# Patient Record
Sex: Male | Born: 1970
Health system: Southern US, Community
[De-identification: ages and names within clinical notes are randomized; demographics above are authoritative.]

## PROBLEM LIST (undated history)

## (undated) HISTORY — PX: BACK SURGERY: SHX140

---

## 2004-11-26 ENCOUNTER — Encounter: Admission: RE | Admit: 2004-11-26 | Discharge: 2004-11-26 | Payer: Self-pay | Admitting: Neurosurgery

## 2005-03-01 ENCOUNTER — Ambulatory Visit (HOSPITAL_COMMUNITY): Admission: RE | Admit: 2005-03-01 | Discharge: 2005-03-02 | Payer: Self-pay | Admitting: Neurosurgery

## 2005-08-22 ENCOUNTER — Ambulatory Visit (HOSPITAL_COMMUNITY): Admission: RE | Admit: 2005-08-22 | Discharge: 2005-08-22 | Payer: Self-pay | Admitting: Neurosurgery

## 2006-01-31 ENCOUNTER — Ambulatory Visit (HOSPITAL_COMMUNITY): Admission: RE | Admit: 2006-01-31 | Discharge: 2006-02-01 | Payer: Self-pay | Admitting: Neurosurgery

## 2006-02-07 ENCOUNTER — Ambulatory Visit (HOSPITAL_COMMUNITY): Admission: RE | Admit: 2006-02-07 | Discharge: 2006-02-08 | Payer: Self-pay | Admitting: Neurosurgery

## 2007-05-06 ENCOUNTER — Emergency Department (HOSPITAL_COMMUNITY): Admission: EM | Admit: 2007-05-06 | Discharge: 2007-05-06 | Payer: Self-pay | Admitting: Emergency Medicine
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
## 2011-03-25 NOTE — H&P (Signed)
[name], [name] NO.:  [id number]   MEDICAL RECORD NO.:  [id number]          PATIENT TYPE:  OIB   LOCATION:  3029                         FACILITY:  [facility]   PHYSICIAN:  Payton Doughty, M.D.      DATE OF BIRTH:  [birth year]   DATE OF ADMISSION:  02/07/2006  DATE OF DISCHARGE:                                HISTORY & PHYSICAL   ADMISSION DIAGNOSIS:  Spinal cord stimulator in place, intractable back  pain.   BODY OF TEXT:  This is a 40 year old gentleman who has had a lumbar  diskectomy and foraminal disk at L4-5 on the left side. She still had left  L4 radiculopathy related to chronic irritation of the nerve root. There is  no decompression available. He has a spinal cord stimulator placed last week  which has given maximum coverage with relief of leg pain. He is admitted now  for placement of battery pack.   PAST MEDICAL HISTORY:  Unremarkable.   PAST SURGICAL HISTORY:  Lumbar diskectomy.   MEDICATIONS:  Pain medications and Lortab.   ALLERGIES:  None.   PHYSICAL EXAMINATION:  GENERAL: He is obese, but otherwise unremarkable.  HEENT: Normal.  NECK: Reasonable range of motion of the neck.  CHEST: Clear.  CARDIAC: Regular rate and rhythm.  ABDOMEN: Nontender without hepatosplenomegaly, but somewhat large.  EXTREMITIES: Without clubbing or cyanosis. Peripheral pulses are good.  GU: Deferred.  NEUROLOGIC: He is awake, alert, and oriented. Cranial nerves are intact.  Motor exam shows 5/5 strength throughout the upper and lower extremities.  Sensation is diminished in the right L4 distribution and his right knee jerk  is absent.   CLINICAL IMPRESSION:  Good relief with his spinal cord stimulator.   PLAN:  Placement of battery. Risks and benefits have been discussed and he  wishes to proceed.           ______________________________  Payton Doughty, M.D.     MWR/MEDQ  D:  02/07/2006  T:  02/07/2006  Job:  [id number]

## 2011-03-25 NOTE — Op Note (Signed)
[name], [name] NO.:  [id number]   MEDICAL RECORD NO.:  [id number]          PATIENT TYPE:  OIB   LOCATION:  3029                         FACILITY:  [facility]   PHYSICIAN:  Payton Doughty, M.D.      DATE OF BIRTH:  June 20, 1971   DATE OF PROCEDURE:  02/07/2006  DATE OF DISCHARGE:  02/08/2006                                 OPERATIVE REPORT   PREOPERATIVE DIAGNOSIS:  Implanted spinal cord stimulator.   POSTOPERATIVE DIAGNOSIS:  Implanted spinal cord stimulator.   OPERATIVE PROCEDURE:  Implantation of battery.   ANESTHESIA:  General endotracheal.   PREP:  Sterile Betadine prep and scrub with alcohol wipe.   COMPLICATIONS:  None.   ASSISTANT:  [place].   INDICATIONS:  This is a 40 year old gentleman with spinal cord stimulator in  place for a trial. He is getting good coverage, so we planned to have the  battery taken out.   DESCRIPTION OF PROCEDURE:  The patient was taken to the operating room,  smoothly anesthetized, and placed prone on the operating table. The old  incision was opened, the wires mobilized. The extension wires were detached  and removed. Subcutaneous pocket was created below the posterior iliac spine  on the right side, and the tunneling device was used to move the wires from  the stimulator site to the battery site. The battery was attached. The  string leads devices attached andthe battery implanted in the pocket.  Impendence testing revealed satisfactory impendences. Both wounds were  irrigated. Hemostasis assured, and the subcutaneous tissues were  reapproximated with 0 Vicryl in interrupted fashion. Subcuticular tissues  were reapproximated with 3-0 Vicryl interrupted fashion. Skin was closed  with 3-0 nylon in a running locked fashion. Betadine and Telfa dressing was  applied and made occlusive with Opsite. The patient returned to the recovery  room in good condition.           ______________________________  Payton Doughty,  M.D.     MWR/MEDQ  D:  02/07/2006  T:  02/08/2006  Job:  [id number]

## 2011-03-25 NOTE — Op Note (Signed)
NAMEORLANDA, LEMMERMAN NO.:  [id number]   MEDICAL RECORD NO.:  [id number]          PATIENT TYPE:  OIB   LOCATION:  2860                         FACILITY:  [facility]   PHYSICIAN:  Payton Doughty, M.D.      DATE OF BIRTH:  1971-06-16   DATE OF PROCEDURE:  03/01/2005  DATE OF DISCHARGE:                                 OPERATIVE REPORT   PREOPERATIVE DIAGNOSIS:  Foraminal disk at L4-5 on the left.   POSTOPERATIVE DIAGNOSIS:  Foraminal disk at L4-5 on the left.   OPERATIVE PROCEDURE:  L4-5 foraminal diskectomy.   SURGEON:  Payton Doughty, M.D.   NURSE ASSISTANT:  [facility].   DOCTOR ASSISTANT:  Stefani Dama, M.D.   ANESTHESIA:  General endotracheal.   PREPARATION:  Sterile Betadine prep and scrub with alcohol wipe.   COMPLICATIONS:  None.   BODY OF TEXT:  A 39 year old gentleman with foraminal disk on the left side  at L4-5.  Taken to the operating room and smoothly anesthetized and  intubated, placed prone on the operating table.  Following shave, prep and  drape in the usual sterile fashion, the skin was incised from the mid-L3 to  mid-L5 and the lamina of L3, L4 and L5 were exposed on the left side in the  subperiosteal plane out over the transverse processes.  Several x-rays were  needed to confirm correctness of the level.  Having confirmed correctness of  the level, the pars interarticularis and a little bit of the inferior  articular process of L4 were drilled off and the ligamentum flavum removed  and the L4 nerve root identified as it traversed the pedicular area.  The  disk space was readily identified and disk material removed laterally.  Milking underneath the annulus, a fragment of disk came out from underneath  the nerve root, resulting in its immediate decompression.  The wound was  irrigated and hemostasis assured, the nerve root explored in all quadrants  and found to be free.  The bone defect was filled with Depo-Medrol-soaked  fat.  The fascia and  subcutaneous tissue were reapproximated with 0 Vicryl  in interrupted fashion, the subcuticular tissue was reapproximated with 3-0  Vicryl, and the skin was closed with 3-0 nylon in a running locked fashion.  Betadine and Telfa dressing was applied, made occlusive with OpSite.  The  patient returned to the recovery room in good condition.     MWR/MEDQ  D:  03/01/2005  T:  03/01/2005  Job:  [id number]

## 2011-03-25 NOTE — Op Note (Signed)
NAMEGREGORIO, [name] NO.:  [id number]   MEDICAL RECORD NO.:  [id number]          PATIENT TYPE:  OIB   LOCATION:  3034                         FACILITY:  [facility]   PHYSICIAN:  Payton Doughty, M.D.      DATE OF BIRTH:  11-Nov-1970   DATE OF PROCEDURE:  01/31/2006  DATE OF DISCHARGE:  02/01/2006                                 OPERATIVE REPORT   PREOPERATIVE DIAGNOSIS:  Intractable left leg pain.   POSTOPERATIVE DIAGNOSIS:  Intractable left leg pain.   OPERATION PERFORMED:  Placement of spinal cord stimulator.   SURGEON:  Payton Doughty, M.D.   ANESTHESIA:  General endotracheal.   PREP:  Sterile Betadine prep and scrub with alcohol wipe.   COMPLICATIONS:  None.   NURSE ASSISTANT:  [place].   DESCRIPTION OF PROCEDURE:  40 year old gentleman with intractable left leg  pain related to nerve root injury.  The patient was taken to the operating  room, smoothly anesthetized, intubated, and placed prone on the operating  table.  Following shave, prep, drape in usual sterile fashion, skin was  infiltrated with 1% lidocaine with 1:400,000 epinephrine.  Skin was incised  from the mid T10 to the bottom of T11.  The lamina of T11 was exposed in  subperiosteal plane bilaterally.  Intraoperative x-ray confirmed correctness  of level.  Having confirmed correctness of level, partial laminectomy of T11  was done to the top of the ligamentum flavum.  It was removed.  The epidural  fat was excised and a stimulating electrode was placed in the epidural  space.  It passed smoothly.  Intraoperative x-ray showed that the electrode  was at the T10-11 interspace extending up to just slightly above the T8-9  interspace exactly in the midline.  Wound was irrigated, hemostasis assured.  The anchoring suture was placed.  Connecting wires attached.  Wires passed  through the subcutaneous tunnel. The incision was then closed with  successive layers of 0 Vicryl, 3-0 Vicryl and 3-0 nylon.   Betadine and Telfa  dressing was applied at both sites.  The patient was then transferred to the  recovery room in good condition.           ______________________________  Payton Doughty, M.D.     MWR/MEDQ  D:  01/31/2006  T:  02/02/2006  Job:  [id number]
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
## 2019-03-06 ENCOUNTER — Emergency Department (HOSPITAL_COMMUNITY)
Admission: EM | Admit: 2019-03-06 | Discharge: 2019-03-06 | Disposition: A | Discharge status: Home / Self Care | Payer: Medicaid - Out of State | Attending: Emergency Medicine | Admitting: Emergency Medicine

## 2019-03-06 ENCOUNTER — Other Ambulatory Visit: Payer: Self-pay

## 2019-03-06 ENCOUNTER — Emergency Department (HOSPITAL_COMMUNITY): Payer: Medicaid - Out of State

## 2019-03-06 ENCOUNTER — Encounter (HOSPITAL_COMMUNITY): Payer: Self-pay | Admitting: Emergency Medicine

## 2019-03-06 DIAGNOSIS — R600 Localized edema: Secondary | ICD-10-CM | POA: Insufficient documentation

## 2019-03-06 DIAGNOSIS — L97519 Non-pressure chronic ulcer of other part of right foot with unspecified severity: Secondary | ICD-10-CM | POA: Insufficient documentation

## 2019-03-06 DIAGNOSIS — F17210 Nicotine dependence, cigarettes, uncomplicated: Secondary | ICD-10-CM | POA: Diagnosis not present

## 2019-03-06 LAB — COMPREHENSIVE METABOLIC PANEL
ALT: 54 U/L — ABNORMAL HIGH (ref 0–44)
AST: 58 U/L — ABNORMAL HIGH (ref 15–41)
Albumin: 3.6 g/dL (ref 3.5–5.0)
Alkaline Phosphatase: 47 U/L (ref 38–126)
Anion gap: 10 (ref 5–15)
BUN: 11 mg/dL (ref 6–20)
CO2: 26 mmol/L (ref 22–32)
Calcium: 9 mg/dL (ref 8.9–10.3)
Chloride: 102 mmol/L (ref 98–111)
Creatinine, Ser: 0.9 mg/dL (ref 0.61–1.24)
GFR calc Af Amer: >60 mL/min (ref >60)
GFR calc non Af Amer: >60 mL/min (ref >60)
Glucose, Bld: 91 mg/dL (ref 70–99)
Potassium: 3.9 mmol/L (ref 3.5–5.1)
Sodium: 138 mmol/L (ref 135–145)
Total Bilirubin: 0.5 mg/dL (ref 0.3–1.2)
Total Protein: 7.2 g/dL (ref 6.5–8.1)

## 2019-03-06 LAB — CBC WITH DIFFERENTIAL/PLATELET
Abs Immature Granulocytes: 0.01 10*3/uL (ref 0.00–0.07)
Basophils Absolute: 0 10*3/uL (ref 0.0–0.1)
Basophils Relative: 1 %
Eosinophils Absolute: 0.3 10*3/uL (ref 0.0–0.5)
Eosinophils Relative: 4 %
HCT: 37.2 % — ABNORMAL LOW (ref 39.0–52.0)
Hemoglobin: 12.1 g/dL — ABNORMAL LOW (ref 13.0–17.0)
Immature Granulocytes: 0 %
Lymphocytes Relative: 26 %
Lymphs Abs: 1.6 10*3/uL (ref 0.7–4.0)
MCH: 28.5 pg (ref 26.0–34.0)
MCHC: 32.5 g/dL (ref 30.0–36.0)
MCV: 87.5 fL (ref 80.0–100.0)
Monocytes Absolute: 0.5 10*3/uL (ref 0.1–1.0)
Monocytes Relative: 9 %
Neutro Abs: 3.7 10*3/uL (ref 1.7–7.7)
Neutrophils Relative %: 60 %
Platelets: 167 10*3/uL (ref 150–400)
RBC: 4.25 MIL/uL (ref 4.22–5.81)
RDW: 13.5 % (ref 11.5–15.5)
WBC: 6.1 10*3/uL (ref 4.0–10.5)
nRBC: 0 % (ref 0.0–0.2)

## 2019-03-06 LAB — SEDIMENTATION RATE: Sed Rate: 10 mm/hr (ref 0–16)

## 2019-03-06 LAB — C-REACTIVE PROTEIN: CRP: 3.2 mg/dL — ABNORMAL HIGH (ref <1.0)

## 2019-03-06 MED ORDER — CIPROFLOXACIN HCL 500 MG PO TABS: 500.0000 mg | ORAL_TABLET | Freq: Two times a day (BID) | ORAL | 0 refills | Status: DC
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
## 2019-03-06 NOTE — ED Notes (Signed)
epd and hospitalitist in room

## 2019-03-06 NOTE — Discharge Instructions (Addendum)
Keep leg elevated when not ambulating. Take cipro antibiotics as prescribed and follow-up closely with wound care doctor and primary doctor and wound care. If you develop pus draining, fevers, spreading redness, worsening signs or symptoms you need to be seen again by a clinician or come to the ER.

## 2019-03-06 NOTE — Consult Note (Signed)
WOC Nurse wound consult note Patient receiving care in [facility] ED 03.  I have completed the [facility] consult for the wound on the plantar surface of the right root via camera and assistance of Becca, RN.  During the camera consult Becca measured the depth of the wound as 0.5 cm with some serous drainage, and has a palpable dorsalis pedis pulse.  I have reviewed the notes from Dr. Abran Duke from today at 2:29.  I understand from this note the wound has been present for a year and that it has undergone recent changes starting Wednesday of last week.   An xray from today did not reveal concerns for osteomyelitis.  CT results from today were "IMPRESSION: No evidence of right lower extremity DVT.  Mild right inguinal adenopathy by short axis diameter measurement criteria. Differential diagnosis includes both inflammatory and neoplastic etiology. Correlate clinically as for the need for further imaging, follow-up imaging, or biopsy."  Additional information contained within Dr. Cherly Hensen' note includes: "Patient has approximately 5 cm large ulcer the plantar aspect of the right foot, mild tenderness surrounding and mild pale discoloration.  No active drainage, odor present.  No significant warmth or induration/cellulitis surrounding.  Patient can move his toes.  Patient has moderate edema to the right lower extremity and right foot.  Neurovascularly intact right lower extremity."    Reason for Consult: chronic wound plantar surface of right foot, with recent changes Wound type: Old trauma injury Measurement: 0.5 cm depth, 5 cm x 5 cm per MD note. Wound bed: pink Drainage (amount, consistency, odor) serous Periwound: peeling Dressing procedure/placement/frequency: Cleanse with saline. Place Aquacel Ag+ [place] [phone]) into the wound bed. Cover with dry gauze. Change daily.  In addition, if you have not already done so, please consider a consult to surgery.  In a chronic wound of this chronicity and recent changes  that include increased swelling of the right leg, increased pain, and erythema to mid calf, a consult to orthopedic surgery seems appropriate.  If you agree, please order.  Monitor the wound area(s) for worsening of condition such as: Signs/symptoms of infection,  Increase in size,  Development of or worsening of odor, Development of pain, or increased pain at the affected locations.  Notify the medical team if any of these develop.  Thank you for the consult.  Discussed plan of care with the patient and bedside nurse.  WOC nurse will not follow at this time.  Please re-consult the WOC team if needed.  Helmut Muster, RN, MSN, CWOCN, CNS-BC, pager [phone]

## 2019-03-06 NOTE — ED Notes (Signed)
Dressing applied to foot .

## 2019-03-06 NOTE — ED Triage Notes (Signed)
Pt C/O ulcer to the bottom of his right foot. Pt states the ulcer has been there for about 1 year. Pt states [place] and wore the boots all day with wet feet.

## 2019-03-06 NOTE — ED Notes (Signed)
Pt awake, ambulating to restroom

## 2019-03-11 LAB — CULTURE, BLOOD (ROUTINE X 2)
Culture: NO GROWTH
Culture: NO GROWTH
Special Requests: ADEQUATE
Special Requests: ADEQUATE

## 2020-05-01 ENCOUNTER — Inpatient Hospital Stay (HOSPITAL_COMMUNITY)
Admission: AD | Admit: 2020-05-01 | Discharge: 2020-05-06 | DRG: 475 | Disposition: A | Discharge status: Home Health | Payer: Medicaid Other | Attending: Internal Medicine | Admitting: Internal Medicine

## 2020-05-01 ENCOUNTER — Inpatient Hospital Stay (HOSPITAL_COMMUNITY): Payer: Medicaid Other

## 2020-05-01 ENCOUNTER — Emergency Department (HOSPITAL_COMMUNITY): Payer: Medicaid Other

## 2020-05-01 ENCOUNTER — Other Ambulatory Visit: Payer: Self-pay

## 2020-05-01 ENCOUNTER — Encounter (HOSPITAL_COMMUNITY): Payer: Self-pay | Admitting: *Deleted

## 2020-05-01 DIAGNOSIS — L03115 Cellulitis of right lower limb: Secondary | ICD-10-CM | POA: Diagnosis present

## 2020-05-01 DIAGNOSIS — M86171 Other acute osteomyelitis, right ankle and foot: Principal | ICD-10-CM | POA: Diagnosis present

## 2020-05-01 DIAGNOSIS — G8929 Other chronic pain: Secondary | ICD-10-CM | POA: Diagnosis present

## 2020-05-01 DIAGNOSIS — M869 Osteomyelitis, unspecified: Secondary | ICD-10-CM | POA: Diagnosis present

## 2020-05-01 DIAGNOSIS — X58XXXA Exposure to other specified factors, initial encounter: Secondary | ICD-10-CM | POA: Diagnosis present

## 2020-05-01 DIAGNOSIS — L97519 Non-pressure chronic ulcer of other part of right foot with unspecified severity: Secondary | ICD-10-CM | POA: Diagnosis present

## 2020-05-01 DIAGNOSIS — Z8249 Family history of ischemic heart disease and other diseases of the circulatory system: Secondary | ICD-10-CM | POA: Diagnosis not present

## 2020-05-01 DIAGNOSIS — I96 Gangrene, not elsewhere classified: Secondary | ICD-10-CM | POA: Diagnosis present

## 2020-05-01 DIAGNOSIS — Z888 Allergy status to other drugs, medicaments and biological substances status: Secondary | ICD-10-CM

## 2020-05-01 DIAGNOSIS — S92321B Displaced fracture of second metatarsal bone, right foot, initial encounter for open fracture: Secondary | ICD-10-CM

## 2020-05-01 DIAGNOSIS — E669 Obesity, unspecified: Secondary | ICD-10-CM | POA: Diagnosis present

## 2020-05-01 DIAGNOSIS — Z20822 Contact with and (suspected) exposure to covid-19: Secondary | ICD-10-CM | POA: Diagnosis present

## 2020-05-01 DIAGNOSIS — Z6835 Body mass index (BMI) 35.0-35.9, adult: Secondary | ICD-10-CM | POA: Diagnosis not present

## 2020-05-01 DIAGNOSIS — F17210 Nicotine dependence, cigarettes, uncomplicated: Secondary | ICD-10-CM | POA: Diagnosis present

## 2020-05-01 DIAGNOSIS — M86671 Other chronic osteomyelitis, right ankle and foot: Secondary | ICD-10-CM | POA: Diagnosis present

## 2020-05-01 DIAGNOSIS — D509 Iron deficiency anemia, unspecified: Secondary | ICD-10-CM | POA: Diagnosis present

## 2020-05-01 DIAGNOSIS — R030 Elevated blood-pressure reading, without diagnosis of hypertension: Secondary | ICD-10-CM | POA: Diagnosis present

## 2020-05-01 DIAGNOSIS — E6609 Other obesity due to excess calories: Secondary | ICD-10-CM | POA: Diagnosis not present

## 2020-05-01 DIAGNOSIS — M84674A Pathological fracture in other disease, right foot, initial encounter for fracture: Secondary | ICD-10-CM | POA: Diagnosis present

## 2020-05-01 DIAGNOSIS — R739 Hyperglycemia, unspecified: Secondary | ICD-10-CM | POA: Diagnosis present

## 2020-05-01 DIAGNOSIS — M549 Dorsalgia, unspecified: Secondary | ICD-10-CM | POA: Diagnosis present

## 2020-05-01 DIAGNOSIS — R7303 Prediabetes: Secondary | ICD-10-CM | POA: Diagnosis present

## 2020-05-01 LAB — CBC WITH DIFFERENTIAL/PLATELET
Abs Immature Granulocytes: 0.02 10*3/uL (ref 0.00–0.07)
Basophils Absolute: 0.1 10*3/uL (ref 0.0–0.1)
Basophils Relative: 1 %
Eosinophils Absolute: 0.3 10*3/uL (ref 0.0–0.5)
Eosinophils Relative: 4 %
HCT: 35.2 % — ABNORMAL LOW (ref 39.0–52.0)
Hemoglobin: 10.8 g/dL — ABNORMAL LOW (ref 13.0–17.0)
Immature Granulocytes: 0 %
Lymphocytes Relative: 26 %
Lymphs Abs: 2 10*3/uL (ref 0.7–4.0)
MCH: 25.6 pg — ABNORMAL LOW (ref 26.0–34.0)
MCHC: 30.7 g/dL (ref 30.0–36.0)
MCV: 83.4 fL (ref 80.0–100.0)
Monocytes Absolute: 0.6 10*3/uL (ref 0.1–1.0)
Monocytes Relative: 8 %
Neutro Abs: 4.6 10*3/uL (ref 1.7–7.7)
Neutrophils Relative %: 61 %
Platelets: 202 10*3/uL (ref 150–400)
RBC: 4.22 MIL/uL (ref 4.22–5.81)
RDW: 15.1 % (ref 11.5–15.5)
WBC: 7.6 10*3/uL (ref 4.0–10.5)
nRBC: 0 % (ref 0.0–0.2)

## 2020-05-01 LAB — COMPREHENSIVE METABOLIC PANEL
ALT: 27 U/L (ref 0–44)
AST: 37 U/L (ref 15–41)
Albumin: 3.1 g/dL — ABNORMAL LOW (ref 3.5–5.0)
Alkaline Phosphatase: 72 U/L (ref 38–126)
Anion gap: 10 (ref 5–15)
BUN: 13 mg/dL (ref 6–20)
CO2: 26 mmol/L (ref 22–32)
Calcium: 8.4 mg/dL — ABNORMAL LOW (ref 8.9–10.3)
Chloride: 97 mmol/L — ABNORMAL LOW (ref 98–111)
Creatinine, Ser: 0.95 mg/dL (ref 0.61–1.24)
GFR calc Af Amer: >60 mL/min (ref >60)
GFR calc non Af Amer: >60 mL/min (ref >60)
Glucose, Bld: 105 mg/dL — ABNORMAL HIGH (ref 70–99)
Potassium: 3.7 mmol/L (ref 3.5–5.1)
Sodium: 133 mmol/L — ABNORMAL LOW (ref 135–145)
Total Bilirubin: 0.3 mg/dL (ref 0.3–1.2)
Total Protein: 8.1 g/dL (ref 6.5–8.1)

## 2020-05-01 LAB — LACTIC ACID, PLASMA: Lactic Acid, Venous: 1.2 mmol/L (ref 0.5–1.9)

## 2020-05-01 LAB — TSH: TSH: 1.511 u[IU]/mL (ref 0.350–4.500)

## 2020-05-01 LAB — MAGNESIUM: Magnesium: 2 mg/dL (ref 1.7–2.4)

## 2020-05-01 LAB — HEMOGLOBIN: Hemoglobin: 10.9 g/dL — ABNORMAL LOW (ref 13.0–17.0)

## 2020-05-01 LAB — SARS CORONAVIRUS 2 BY RT PCR (HOSPITAL ORDER, PERFORMED IN ~~LOC~~ HOSPITAL LAB): SARS Coronavirus 2: NEGATIVE

## 2020-05-01 MED ORDER — ACETAMINOPHEN 650 MG RE SUPP: 650.0000 mg | Freq: Four times a day (QID) | RECTAL | Status: DC | PRN

## 2020-05-01 MED ORDER — IOHEXOL 300 MG/ML  SOLN
75.0000 mL | Freq: Once | INTRAMUSCULAR | Status: AC | PRN
  Administered 2020-05-01: 75 mL via INTRAVENOUS

## 2020-05-01 MED ORDER — PIPERACILLIN-TAZOBACTAM 3.375 G IVPB
3.3750 g | Freq: Three times a day (TID) | INTRAVENOUS | Status: DC
  Administered 2020-05-01 – 2020-05-04 (×8): 3.375 g via INTRAVENOUS
  Filled 2020-05-01 (×8): qty 50

## 2020-05-01 MED ORDER — VANCOMYCIN HCL 1250 MG/250ML IV SOLN
1250.0000 mg | Freq: Two times a day (BID) | INTRAVENOUS | Status: DC
  Administered 2020-05-01 – 2020-05-03 (×5): 1250 mg via INTRAVENOUS
  Filled 2020-05-01 (×6): qty 250

## 2020-05-01 MED ORDER — ONDANSETRON HCL 4 MG/2ML IJ SOLN: 4.0000 mg | Freq: Four times a day (QID) | INTRAMUSCULAR | Status: DC | PRN

## 2020-05-01 MED ORDER — NICOTINE 14 MG/24HR TD PT24
14.0000 mg | MEDICATED_PATCH | Freq: Every day | TRANSDERMAL | Status: DC
  Administered 2020-05-01 – 2020-05-06 (×6): 14 mg via TRANSDERMAL
  Filled 2020-05-01 (×6): qty 1

## 2020-05-01 MED ORDER — PIPERACILLIN-TAZOBACTAM 3.375 G IVPB 30 MIN
3.3750 g | Freq: Once | INTRAVENOUS | Status: AC
  Administered 2020-05-01: 3.375 g via INTRAVENOUS
  Filled 2020-05-01: qty 50

## 2020-05-01 MED ORDER — VANCOMYCIN HCL 2000 MG/400ML IV SOLN
2000.0000 mg | Freq: Once | INTRAVENOUS | Status: AC
  Administered 2020-05-01: 2000 mg via INTRAVENOUS
  Filled 2020-05-01: qty 400

## 2020-05-01 MED ORDER — ACETAMINOPHEN 325 MG PO TABS
650.0000 mg | ORAL_TABLET | Freq: Four times a day (QID) | ORAL | Status: DC | PRN
  Administered 2020-05-01: 650 mg via ORAL
  Filled 2020-05-01: qty 2

## 2020-05-01 MED ORDER — ONDANSETRON HCL 4 MG PO TABS: 4.0000 mg | ORAL_TABLET | Freq: Four times a day (QID) | ORAL | Status: DC | PRN

## 2020-05-01 NOTE — Plan of Care (Signed)
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
## 2020-05-01 NOTE — ED Notes (Signed)
Report to care link, pt from dpt via care link °

## 2020-05-01 NOTE — Progress Notes (Signed)
Pharmacy Antibiotic Note  [name] is a 49 y.o. male admitted on 05/01/2020 with osteomyelitis.  Pharmacy has been consulted for Vancomycin and Zosyn dosing.  Plan: Vancomycin 2000 mg IV x 1 dose. Vancomycin 1250 mg IV every 12 hours. Zosyn 3.375g IV every 8 hours. Monitor labs, c/s, and vanco level as indicated.  Height: 6' (182.9 cm) Weight: 117.9 kg (260 lb) IBW/kg (Calculated) : 77.6  Temp (24hrs), Avg:98.6 F (37 C), Min:98.3 F (36.8 C), Max:98.8 F (37.1 C)  Recent Labs  Lab 05/01/20 0739  WBC 7.6  CREATININE 0.95  LATICACIDVEN 1.2    Estimated Creatinine Clearance: 121.9 mL/min (by C-G formula based on SCr of 0.95 mg/dL).    Allergies  Allergen Reactions  . Ceftriaxone Diarrhea, Nausea Only and Other (See Comments)    Nausea, diarrhea.     Antimicrobials this admission: Vanco 6/25 >>  Zosyn 6/25 >>   Dose adjustments this admission: N/A  Microbiology results: 6/25 BCx: pending  Thank you for allowing pharmacy to be a part of this patient's care.  Tad Moore 05/01/2020 11:31 AM

## 2020-05-01 NOTE — Progress Notes (Signed)
Pt arrived to 6N[facility] via [facility] from [facility]. Received report from [place], Charity fundraiser. See assessment. Will continue to monitor.
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
## 2020-05-01 NOTE — ED Provider Notes (Signed)
[facility] EMERGENCY DEPARTMENT Provider Note   CSN: [id number] Arrival date & time: 05/01/20  1779     History Chief Complaint  Patient presents with  . Foot Injury    right    [name] is a 49 y.o. male.  HPI He presents for evaluation of right lower leg and foot swelling with drainage from the right foot, for 1 week.  Prior injury to right foot, about a year ago with chronic wound, that had healed.  He denies history of diabetes.  He has chronic back pain.  There is been no fever, chills, vomiting, dizziness or weakness.  No history of venous thromboembolism.  No recent sick contacts.  There are no other known modifying factors.    History reviewed. No pertinent past medical history.  Patient Active Problem List   Diagnosis Date Noted  . Foot osteomyelitis, right (HCC) 05/01/2020    Past Surgical History:  Procedure Laterality Date  . BACK SURGERY         History reviewed. No pertinent family history.  Social History   Tobacco Use  . Smoking status: Current Every Day Smoker    Packs/day: 0.50    Years: 20.00    Pack years: 10.00    Types: Cigarettes  . Smokeless tobacco: Never Used  Substance Use Topics  . Alcohol use: Yes  . Drug use: Never    Home Medications Prior to Admission medications   Medication Sig Start Date End Date Taking? Authorizing Provider  ciprofloxacin (CIPRO) 500 MG tablet Take 1 tablet (500 mg total) by mouth 2 (two) times daily. One po bid x 7 days 03/06/19   Blane Ohara, MD    Allergies    Ceftriaxone  Review of Systems   Review of Systems  All other systems reviewed and are negative.   Physical Exam Updated Vital Signs BP (!) 142/98   Pulse 85   Temp 98.8 F (37.1 C) (Oral)   Resp 12   Ht 6' (1.829 m)   Wt 117.9 kg   SpO2 99%   BMI 35.26 kg/m   Physical Exam Vitals and nursing note reviewed.  Constitutional:      Appearance: He is well-developed.  HENT:     Head: Normocephalic and atraumatic.      Right Ear: External ear normal.     Left Ear: External ear normal.  Eyes:     Conjunctiva/sclera: Conjunctivae normal.     Pupils: Pupils are equal, round, and reactive to light.  Neck:     Trachea: Phonation normal.  Cardiovascular:     Rate and Rhythm: Normal rate.  Pulmonary:     Effort: Pulmonary effort is normal.  Abdominal:     General: There is no distension.  Musculoskeletal:     Cervical back: Normal range of motion and neck supple.     Comments: Right lower leg and foot with moderate swelling.  Open draining wound between first and second metatarsals, near the toes.  Moderate tenderness of the right forefoot.  Swelling of the right lower extremity, much greater than left.  No proximal streaking of the ankle or foot.  Skin:    General: Skin is warm and dry.  Neurological:     Mental Status: He is alert and oriented to person, place, and time.     Cranial Nerves: No cranial nerve deficit.     Sensory: No sensory deficit.     Motor: No abnormal muscle tone.     Coordination:  Coordination normal.  Psychiatric:        Mood and Affect: Mood normal.        Behavior: Behavior normal.        Thought Content: Thought content normal.        Judgment: Judgment normal.     ED Results / Procedures / Treatments   Labs (all labs ordered are listed, but only abnormal results are displayed) Labs Reviewed  COMPREHENSIVE METABOLIC PANEL - Abnormal; Notable for the following components:      Result Value   Sodium 133 (*)    Chloride 97 (*)    Glucose, Bld 105 (*)    Calcium 8.4 (*)    Albumin 3.1 (*)    All other components within normal limits  CBC WITH DIFFERENTIAL/PLATELET - Abnormal; Notable for the following components:   Hemoglobin 10.8 (*)    HCT 35.2 (*)    MCH 25.6 (*)    All other components within normal limits  CULTURE, BLOOD (ROUTINE X 2)  SARS CORONAVIRUS 2 BY RT PCR (HOSPITAL ORDER, PERFORMED IN [facility])  LACTIC ACID, PLASMA     EKG None  Radiology US Venous Img Lower Bilateral  Result Date: 05/01/2020 CLINICAL DATA:  Bilateral lower extremity edema and right foot erythema. EXAM: BILATERAL LOWER EXTREMITY VENOUS DOPPLER ULTRASOUND TECHNIQUE: Gray-scale sonography with graded compression, as well as color Doppler and duplex ultrasound were performed to evaluate the lower extremity deep venous systems from the level of the common femoral vein and including the common femoral, femoral, profunda femoral, popliteal and calf veins including the posterior tibial, peroneal and gastrocnemius veins when visible. The superficial great saphenous vein was also interrogated. Spectral Doppler was utilized to evaluate flow at rest and with distal augmentation maneuvers in the common femoral, femoral and popliteal veins. COMPARISON:  Prior right lower extremity study on 03/06/2019 FINDINGS: RIGHT LOWER EXTREMITY Common Femoral Vein: No evidence of thrombus. Normal compressibility, respiratory phasicity and response to augmentation. Saphenofemoral Junction: No evidence of thrombus. Normal compressibility and flow on color Doppler imaging. Profunda Femoral Vein: No evidence of thrombus. Normal compressibility and flow on color Doppler imaging. Femoral Vein: No evidence of thrombus. Normal compressibility, respiratory phasicity and response to augmentation. Popliteal Vein: No evidence of thrombus. Normal compressibility, respiratory phasicity and response to augmentation. Calf Veins: No evidence of thrombus. Normal compressibility and flow on color Doppler imaging. Superficial Great Saphenous Vein: No evidence of thrombus. Normal compressibility. Venous Reflux:  None. Other Findings: Stable appearance by ultrasound of mildly prominent right inguinal lymph nodes. No evidence of superficial thrombophlebitis. No abnormal fluid collections. LEFT LOWER EXTREMITY Common Femoral Vein: No evidence of thrombus. Normal compressibility, respiratory phasicity  and response to augmentation. Saphenofemoral Junction: No evidence of thrombus. Normal compressibility and flow on color Doppler imaging. Profunda Femoral Vein: No evidence of thrombus. Normal compressibility and flow on color Doppler imaging. Femoral Vein: No evidence of thrombus. Normal compressibility, respiratory phasicity and response to augmentation. Popliteal Vein: No evidence of thrombus. Normal compressibility, respiratory phasicity and response to augmentation. Calf Veins: No evidence of thrombus. Normal compressibility and flow on color Doppler imaging. Superficial Great Saphenous Vein: No evidence of thrombus. Normal compressibility. Venous Reflux:  None. Other Findings: No evidence of superficial thrombophlebitis or abnormal fluid collection. IMPRESSION: No evidence of deep venous thrombosis in either lower extremity. Electronically Signed   By: Irish Lack M.D.   On: 05/01/2020 09:42   DG Foot Complete Right  Result Date: 05/01/2020 CLINICAL DATA:  Follow-up right foot ulcer with increased swelling and drainage EXAM: RIGHT FOOT COMPLETE - 3+ VIEW COMPARISON:  03/06/2019 FINDINGS: Considerable soft tissue swelling is noted in the right foot particularly within the second digit. Increased sclerosis in the second proximal phalanx is noted. There is interval fracture through the distal aspect of the second metatarsal with displacement of the metatarsal head. This is new from the prior exam. Additionally some callus formation is noted as well as bony destructive change. Deformity of the third metatarsal is noted as well. Previously seen soft tissue wound is not as well appreciated. IMPRESSION: Changes consistent with interval fracture in the distal aspect of the second metatarsal. Associated soft tissue swelling and bony erosive changes are noted highly suspicious for osteomyelitis. MRI would be helpful for further evaluation. Chronic deformity of the third metatarsal is seen which may be related to  prior fracture and healing. Electronically Signed   By: Alcide Clever M.D.   On: 05/01/2020 08:19    Procedures .Critical Care Performed by: Mancel Bale, MD Authorized by: Mancel Bale, MD   Critical care provider statement:    Critical care time (minutes):  55   Critical care start time:  05/01/2020 7:10 AM   Critical care time was exclusive of:  Separately billable procedures and treating other patients   Critical care was time spent personally by me on the following activities:  Blood draw for specimens, development of treatment plan with patient or surrogate, discussions with consultants, evaluation of patient's response to treatment, examination of patient, obtaining history from patient or surrogate, ordering and performing treatments and interventions, ordering and review of laboratory studies, pulse oximetry, re-evaluation of patient's condition, review of old charts and ordering and review of radiographic studies   (including critical care time)  Medications Ordered in ED Medications  vancomycin (VANCOREADY) IVPB 2000 mg/400 mL (has no administration in time range)  piperacillin-tazobactam (ZOSYN) IVPB 3.375 g (3.375 g Intravenous New Bag/Given 05/01/20 1026)    ED Course  I have reviewed the triage vital signs and the nursing notes.  Pertinent labs & imaging results that were available during my care of the patient were reviewed by me and considered in my medical decision making (see chart for details).  Clinical Course as of May 01 1106  Fri May 01, 2020  0942 Normal except sodium low, chloride low, glucose high, calcium low, albumin low  Comprehensive metabolic panel(!) [EW]  0945 Normal  Lactic acid, plasma [EW]  0945 Normal except hemoglobin low  CBC with Differential(!) [EW]  1010 The case was discussed with Dr. Ranell Patrick, orthopedics.  He suggested the patient be admitted to [facility] by the hospitalist, and he will see as a consultant for management of  osteomyelitis with fracture.  He also recommends initiation of empiric antibiotic treatment, at this time.   [EW]  1011 Empiric antibiotics ordered, Zosyn and vancomycin, pharmacy to dose   [EW]    Clinical Course User Index [EW] Mancel Bale, MD   MDM Rules/Calculators/A&P                           Patient Vitals for the past 24 hrs:  BP Temp Temp src Pulse Resp SpO2 Height Weight  05/01/20 1026 (!) 142/98 -- -- 85 12 99 % -- --  05/01/20 0704 -- -- -- -- -- -- 6' (1.829 m) 117.9 kg  05/01/20 0703 (!) 134/96 98.8 F (37.1 C) Oral 90 18 100 % -- --  11:07 AM Reevaluation with update and discussion. After initial assessment and treatment, an updated evaluation reveals he remains comfortable has no further complaints.  He is agreeable to hospitalization.  Findings discussed and questions answered. Daleen Bo   Medical Decision Making:  This patient is presenting for evaluation of swelling of right lower leg and foot, with likely infection, and possible DVT, which does require a range of treatment options, and is a complaint that involves a high risk of morbidity and mortality. The differential diagnoses include cellulitis, osteomyelitis, septic thrombophlebitis. I decided to review old records, and in summary obese male with chronic wound right lower leg, recurrent..  I did not require additional historical information from anyone.  Clinical Laboratory Tests Ordered, included CBC, Metabolic panel and Lactate, blood cultures, Covid testing. Review indicates normal lactate, mild hyponatremia and hyperglycemia, hemoglobin low. Radiologic Tests Ordered, included DVT study, right lower leg, x-ray right foot.  I independently Visualized: Radiographic images, which show fracture second metatarsal with osteomyelitis and chronic appearing injury to third metatarsal.  Cardiac Monitor Tracing which shows normal sinus rhythm    Critical Interventions-clinical evaluation, laboratory  testing, imaging studies, medication treatment, observation reassessment  After These Interventions, the Patient was reevaluated and was found patient to require admission for further care of osteomyelitis with chronic foot infection, foot fracture, and lower leg pain with swelling.  CRITICAL CARE-yes Performed by: Daleen Bo  Nursing Notes Reviewed/ Care Coordinated Applicable Imaging Reviewed Interpretation of Laboratory Data incorporated into ED treatment   10:15 AM-Consult complete with hospitalist. Patient case explained and discussed.  He agrees to admit patient for further evaluation and treatment. Call ended at 10:40 AM  Plan: Admit     Final Clinical Impression(s) / ED Diagnoses Final diagnoses:  Osteomyelitis of right foot, unspecified type ()  Open displaced fracture of second metatarsal bone of right foot, initial encounter    Rx / DC Orders ED Discharge Orders    None       Daleen Bo, MD 05/01/20 1107

## 2020-05-02 DIAGNOSIS — M86671 Other chronic osteomyelitis, right ankle and foot: Secondary | ICD-10-CM

## 2020-05-02 LAB — BASIC METABOLIC PANEL
Anion gap: 9 (ref 5–15)
BUN: 8 mg/dL (ref 6–20)
CO2: 27 mmol/L (ref 22–32)
Calcium: 8.5 mg/dL — ABNORMAL LOW (ref 8.9–10.3)
Chloride: 100 mmol/L (ref 98–111)
Creatinine, Ser: 0.88 mg/dL (ref 0.61–1.24)
GFR calc Af Amer: >60 mL/min (ref >60)
GFR calc non Af Amer: >60 mL/min (ref >60)
Glucose, Bld: 108 mg/dL — ABNORMAL HIGH (ref 70–99)
Potassium: 5.2 mmol/L — ABNORMAL HIGH (ref 3.5–5.1)
Sodium: 136 mmol/L (ref 135–145)

## 2020-05-02 LAB — CBC
HCT: 37.2 % — ABNORMAL LOW (ref 39.0–52.0)
Hemoglobin: 11.3 g/dL — ABNORMAL LOW (ref 13.0–17.0)
MCH: 25.1 pg — ABNORMAL LOW (ref 26.0–34.0)
MCHC: 30.4 g/dL (ref 30.0–36.0)
MCV: 82.5 fL (ref 80.0–100.0)
Platelets: 160 10*3/uL (ref 150–400)
RBC: 4.51 MIL/uL (ref 4.22–5.81)
RDW: 14.7 % (ref 11.5–15.5)
WBC: 5.6 10*3/uL (ref 4.0–10.5)
nRBC: 0 % (ref 0.0–0.2)

## 2020-05-02 LAB — SURGICAL PCR SCREEN
MRSA, PCR: POSITIVE — AB
Staphylococcus aureus: POSITIVE — AB

## 2020-05-02 MED ORDER — OXYCODONE HCL 5 MG PO TABS
5.0000 mg | ORAL_TABLET | ORAL | Status: DC | PRN
  Administered 2020-05-02: 5 mg via ORAL
  Administered 2020-05-02 (×2): 10 mg via ORAL
  Administered 2020-05-02: 5 mg via ORAL
  Administered 2020-05-03: 10 mg via ORAL
  Filled 2020-05-02 (×5): qty 2

## 2020-05-02 MED ORDER — OXYCODONE HCL 5 MG PO TABS
5.0000 mg | ORAL_TABLET | ORAL | Status: DC | PRN
  Administered 2020-05-02: 5 mg via ORAL
  Filled 2020-05-02: qty 1

## 2020-05-02 NOTE — Consult Note (Signed)
  ORTHOPAEDIC CONSULTATION  REQUESTING PHYSICIAN: Gherghe, Costin M, MD  Chief Complaint: Pain swelling cellulitis and purulent drainage right foot.  HPI: [name] is a 49 y.o. male who presents with chronic ulceration swelling and purulent drainage right foot.  Patient states that he has had symptoms for approximately 6 months.  He states he has had on and off purulent drainage at this time he complains of pain swelling and persistent purulent drainage right foot.  History reviewed. No pertinent past medical history. Past Surgical History:  Procedure Laterality Date  . BACK SURGERY     Social History   Socioeconomic History  . Marital status: Married    Spouse name: Not on file  . Number of children: Not on file  . Years of education: Not on file  . Highest education level: Not on file  Occupational History  . Not on file  Tobacco Use  . Smoking status: Current Every Day Smoker    Packs/day: 0.50    Years: 20.00    Pack years: 10.00    Types: Cigarettes  . Smokeless tobacco: Never Used  Substance and Sexual Activity  . Alcohol use: Yes  . Drug use: Never  . Sexual activity: Not on file  Other Topics Concern  . Not on file  Social History Narrative  . Not on file   Social Determinants of Health   Financial Resource Strain:   . Difficulty of Paying Living Expenses:   Food Insecurity:   . Worried About Running Out of Food in the Last Year:   . Ran Out of Food in the Last Year:   Transportation Needs:   . Lack of Transportation (Medical):   . Lack of Transportation (Non-Medical):   Physical Activity:   . Days of Exercise per Week:   . Minutes of Exercise per Session:   Stress:   . Feeling of Stress :   Social Connections:   . Frequency of Communication with Friends and Family:   . Frequency of Social Gatherings with Friends and Family:   . Attends Religious Services:   . Active Member of Clubs or Organizations:   . Attends Club or Organization  Meetings:   . Marital Status:    History reviewed. No pertinent family history. - negative except otherwise stated in the family history section Allergies  Allergen Reactions  . Ceftriaxone Diarrhea, Nausea Only and Other (See Comments)    Nausea, diarrhea.    Prior to Admission medications   Not on File   CT FOOT RIGHT W CONTRAST  Result Date: 05/01/2020 CLINICAL DATA:  Right foot pain, swelling and drainage since an injury 1 year ago. Wound on the plantar surface of the foot at the second toe. EXAM: CT OF THE LOWER RIGHT EXTREMITY WITH CONTRAST TECHNIQUE: Multidetector CT imaging of the lower right extremity was performed according to the standard protocol following intravenous contrast administration. COMPARISON:  Plain films right foot 05/01/2020. CONTRAST:  75 mL OMNIPAQUE IOHEXOL 300 MG/ML  SOLN FINDINGS: Bones/Joint/Cartilage Extensive periosteal reaction is present along the most the entire second metatarsal and proximal phalanx of the second toe. Marked cortical thickening consistent with periosteal reaction is also seen throughout almost the entire third metatarsal. Bony destructive change about the third MTP joint is identified. Mild periosteal reaction base of the proximal phalanx of the third toe is seen. Bony destructive change and irregularity at the IP joint of the great toe is identified. Margins are corticated. Ligaments Suboptimally assessed by CT. Muscles   and Tendons There is marked atrophy of intrinsic musculature the foot. No intramuscular fluid collection is identified. Soft tissues Intense subcutaneous edema is present about the foot. There is a wound on the plantar surface of the foot at approximately the level of the second MTP joint. Deep to the wound, there is a fragment of bone measuring 1.3 cm transverse by 1.8 cm long by 1.5 cm craniocaudal which is likely the second metatarsal head. Soft tissue gas and marked swelling are present about the second toe. IMPRESSION: Large  skin wound on the plantar surface of the foot with a bone fragment deep to it which appears to be the second metatarsal head. Findings consistent with osteomyelitis throughout almost the entire second and third metatarsals and proximal phalanges of the second and third toes. Destructive change about the second and third MTP joints is worrisome for septic joints. Destructive change about the IP joint of the great toe is likely due to prior septic joint. Margins are corticated. Electronically Signed   By: Drusilla Kanner M.D.   On: 05/01/2020 12:47   US Venous Img Lower Bilateral  Result Date: 05/01/2020 CLINICAL DATA:  Bilateral lower extremity edema and right foot erythema. EXAM: BILATERAL LOWER EXTREMITY VENOUS DOPPLER ULTRASOUND TECHNIQUE: Gray-scale sonography with graded compression, as well as color Doppler and duplex ultrasound were performed to evaluate the lower extremity deep venous systems from the level of the common femoral vein and including the common femoral, femoral, profunda femoral, popliteal and calf veins including the posterior tibial, peroneal and gastrocnemius veins when visible. The superficial great saphenous vein was also interrogated. Spectral Doppler was utilized to evaluate flow at rest and with distal augmentation maneuvers in the common femoral, femoral and popliteal veins. COMPARISON:  Prior right lower extremity study on 03/06/2019 FINDINGS: RIGHT LOWER EXTREMITY Common Femoral Vein: No evidence of thrombus. Normal compressibility, respiratory phasicity and response to augmentation. Saphenofemoral Junction: No evidence of thrombus. Normal compressibility and flow on color Doppler imaging. Profunda Femoral Vein: No evidence of thrombus. Normal compressibility and flow on color Doppler imaging. Femoral Vein: No evidence of thrombus. Normal compressibility, respiratory phasicity and response to augmentation. Popliteal Vein: No evidence of thrombus. Normal compressibility,  respiratory phasicity and response to augmentation. Calf Veins: No evidence of thrombus. Normal compressibility and flow on color Doppler imaging. Superficial Great Saphenous Vein: No evidence of thrombus. Normal compressibility. Venous Reflux:  None. Other Findings: Stable appearance by ultrasound of mildly prominent right inguinal lymph nodes. No evidence of superficial thrombophlebitis. No abnormal fluid collections. LEFT LOWER EXTREMITY Common Femoral Vein: No evidence of thrombus. Normal compressibility, respiratory phasicity and response to augmentation. Saphenofemoral Junction: No evidence of thrombus. Normal compressibility and flow on color Doppler imaging. Profunda Femoral Vein: No evidence of thrombus. Normal compressibility and flow on color Doppler imaging. Femoral Vein: No evidence of thrombus. Normal compressibility, respiratory phasicity and response to augmentation. Popliteal Vein: No evidence of thrombus. Normal compressibility, respiratory phasicity and response to augmentation. Calf Veins: No evidence of thrombus. Normal compressibility and flow on color Doppler imaging. Superficial Great Saphenous Vein: No evidence of thrombus. Normal compressibility. Venous Reflux:  None. Other Findings: No evidence of superficial thrombophlebitis or abnormal fluid collection. IMPRESSION: No evidence of deep venous thrombosis in either lower extremity. Electronically Signed   By: Irish Lack M.D.   On: 05/01/2020 09:42   DG Foot Complete Right  Result Date: 05/01/2020 CLINICAL DATA:  Follow-up right foot ulcer with increased swelling and drainage EXAM: RIGHT FOOT COMPLETE - 3+  VIEW COMPARISON:  03/06/2019 FINDINGS: Considerable soft tissue swelling is noted in the right foot particularly within the second digit. Increased sclerosis in the second proximal phalanx is noted. There is interval fracture through the distal aspect of the second metatarsal with displacement of the metatarsal head. This is new  from the prior exam. Additionally some callus formation is noted as well as bony destructive change. Deformity of the third metatarsal is noted as well. Previously seen soft tissue wound is not as well appreciated. IMPRESSION: Changes consistent with interval fracture in the distal aspect of the second metatarsal. Associated soft tissue swelling and bony erosive changes are noted highly suspicious for osteomyelitis. MRI would be helpful for further evaluation. Chronic deformity of the third metatarsal is seen which may be related to prior fracture and healing. Electronically Signed   By: Inez Catalina M.D.   On: 05/01/2020 08:19   - pertinent xrays, CT, MRI studies were reviewed and independently interpreted  Positive ROS: All other systems have been reviewed and were otherwise negative with the exception of those mentioned in the HPI and as above.  Physical Exam: General: Alert, no acute distress Psychiatric: Patient is competent for consent with normal mood and affect Lymphatic: No axillary or cervical lymphadenopathy Cardiovascular: No pedal edema Respiratory: No cyanosis, no use of accessory musculature GI: No organomegaly, abdomen is soft and non-tender    Images:  @ENCIMAGES @  Labs:  Lab Results  Component Value Date   ESRSEDRATE 10 03/06/2019   CRP 3.2 (H) 03/06/2019   REPTSTATUS PENDING 05/01/2020   CULT  05/01/2020    NO GROWTH < 24 HOURS Performed at [facility], [address], [place], [place] [address]     Lab Results  Component Value Date   ALBUMIN 3.1 (L) 05/01/2020   ALBUMIN 3.6 03/06/2019    Neurologic: Patient does not have protective sensation bilateral lower extremities.   MUSCULOSKELETAL:   Skin: Examination patient has swelling and cellulitis of the right foot he is painful to palpation forefoot midfoot and hindfoot.  I cannot palpate a pulse secondary to the swelling.  There is purulent drainage from the plantar aspect of his foot as well as sausage  digit swelling of the second toe.  Patient has destructive lytic changes of the second metatarsal consistent with chronic osteomyelitis as well as fibrous bony changes to the entire third metatarsal also consistent with chronic osteomyelitis.  There is a plantar draining ulcer with purulent drainage.  Assessment: Assessment: Chronic osteomyelitis of the second and third metatarsals right foot with cellulitis and infection extending into the hindfoot.  Plan: Plan: We will plan for a transtibial amputation.  Risks and benefits were discussed including risk of the wound not healing need for additional surgery.  Patient states he understands wished to proceed at this time we will plan for surgery tomorrow morning Sunday.  Thank you for the consult and the opportunity to see Mr. Kori Colin, [place] [phone] 8:52 AM

## 2020-05-02 NOTE — Progress Notes (Addendum)
PROGRESS NOTE  [name] NTI:[id number] DOB: 1970/11/08 DOA: 05/01/2020 PCP: Patient, No Pcp Per   LOS: 1 day   Brief Narrative / Interim history: 49 year old male with history of obesity, tobacco use, chronic back pain who came into the ED due to worsening swelling, pain and drainage out of his right foot.  Patient has been having problems with the right foot on and off for the past couple years, at one point he was following with podiatry, however in the last 2 weeks he appreciates that he developed a new drainage and swelling and decided to come to the hospital.  Patient was placed on IV antibiotics, CT scan of the foot showed extensive osteomyelitis/tissue infection, concern for septic arthritis.  Orthopedic surgery was consulted.  Subjective / 24h Interval events: Complains of pain this morning, states that Tylenol is not working.  Denies any shortness of breath.  No personal history of heart disease or chest pains.  Assessment & Plan: Principal Problem Right foot osteomyelitis-patient was started on antibiotics, continue, appreciate orthopedic surgery consultation.  I have discussed his case with Dr. Lajoyce Corners this morning, patient will be taken to the OR on 6/27 for right BKA.  Continue to monitor cultures  Active Problems Obesity-BMI 35.2, he will need to lose weight which will also help when he will be fitted with a right foot prosthesis  Chronic back pain-continue as needed analgesics  Nicotine abuse-he seems determined to quit, continue nicotine patch  Anemia-likely in the setting of chronic smoldering infection, check anemia panel in the morning  Hyperglycemia-no history of diabetes, check A1c  Scheduled Meds:  nicotine  14 mg Transdermal Daily   Continuous Infusions:  piperacillin-tazobactam (ZOSYN)  IV 3.375 g (05/02/20 1050)   vancomycin 1,250 mg (05/01/20 2253)   PRN Meds:.acetaminophen **OR** acetaminophen, ondansetron **OR** ondansetron (ZOFRAN) IV,  oxyCODONE  Diet Orders (From admission, onward)    Start     Ordered   05/01/20 1113  Diet regular Room service appropriate? Yes; Fluid consistency: Thin  Diet effective now       Question Answer Comment  Room service appropriate? Yes   Fluid consistency: Thin      05/01/20 1117          DVT prophylaxis: SCDs    Code Status: Full Code  Family Communication: no family at bedside   Status is: Inpatient  Remains inpatient appropriate because:BKA tomorrow  Dispo: The patient is from: Home              Anticipated d/c is to: Home              Anticipated d/c date is: 3 days              Patient currently is not medically stable to d/c.  Consultants:  Orthopedic surgery   Procedures:  None   Microbiology  None   Antimicrobials: Vancomycin 6/25 >> Zosyn 6/25 >>   Objective: Vitals:   05/01/20 1422 05/01/20 2045 05/02/20 0138 05/02/20 0534  BP: 133/77 132/75 134/76 132/74  Pulse: 64 65 64 66  Resp: 14 16 16 17   Temp: 98.6 F (37 C) 98.5 F (36.9 C) 98.4 F (36.9 C) 98.2 F (36.8 C)  TempSrc: Oral Oral Oral Oral  SpO2: 100% 100% 99% 99%  Weight:      Height:        Intake/Output Summary (Last 24 hours) at 05/02/2020 1109 Last data filed at 05/02/2020 0754 Gross per 24 hour  Intake 1050 ml  Output 1850 ml  Net -800 ml   Filed Weights   05/01/20 0704  Weight: 117.9 kg   Examination: Constitutional: NAD Eyes: no scleral icterus ENMT: Mucous membranes are moist.  Neck: normal, supple Respiratory: clear to auscultation bilaterally, no wheezing, no crackles.  Cardiovascular: Regular rate and rhythm, no murmurs / rubs / gallops.  Abdomen: non distended, no tenderness. Bowel sounds positive.  Musculoskeletal: no clubbing / cyanosis.  Right foot Ace wrapped Skin: no rashes Neurologic: CN 2-12 grossly intact. Strength 5/5 in all 4.   Data Reviewed: I have independently reviewed following labs and imaging studies   CBC: Recent Labs  Lab 05/01/20 0739  05/02/20 0300  WBC 7.6 5.6  NEUTROABS 4.6  --   HGB 10.9*   10.8* 11.3*  HCT 35.2* 37.2*  MCV 83.4 82.5  PLT 202 683   Basic Metabolic Panel: Recent Labs  Lab 05/01/20 0739 05/02/20 0300  NA 133* 136  K 3.7 5.2*  CL 97* 100  CO2 26 27  GLUCOSE 105* 108*  BUN 13 8  CREATININE 0.95 0.88  CALCIUM 8.4* 8.5*  MG 2.0  --    Liver Function Tests: Recent Labs  Lab 05/01/20 0739  AST 37  ALT 27  ALKPHOS 72  BILITOT 0.3  PROT 8.1  ALBUMIN 3.1*   Coagulation Profile: No results for input(s): INR, PROTIME in the last 168 hours. HbA1C: No results for input(s): HGBA1C in the last 72 hours. CBG: No results for input(s): GLUCAP in the last 168 hours.  Recent Results (from the past 240 hour(s))  Culture, blood (routine x 2)     Status: None (Preliminary result)   Collection Time: 05/01/20  7:39 AM   Specimen: BLOOD  Result Value Ref Range Status   Specimen Description BLOOD SITE NOT SPECIFIED DRAWN BY RN  Final   Special Requests   Final    BOTTLES DRAWN AEROBIC AND ANAEROBIC Blood Culture results may not be optimal due to an inadequate volume of blood received in culture bottles   Culture   Final    NO GROWTH < 24 HOURS Performed at [facility], [address] [place], [place] [address]    Report Status PENDING  Incomplete  SARS Coronavirus 2 by RT PCR (hospital order, performed in [place] hospital lab) Nasopharyngeal Nasopharyngeal Swab     Status: None   Collection Time: 05/01/20 11:48 AM   Specimen: Nasopharyngeal Swab  Result Value Ref Range Status   SARS Coronavirus 2 NEGATIVE NEGATIVE Final    Comment: (NOTE) SARS-CoV-2 target nucleic acids are NOT DETECTED.  The SARS-CoV-2 RNA is generally detectable in upper and lower respiratory specimens during the acute phase of infection. The lowest concentration of SARS-CoV-2 viral copies this assay can detect is 250 copies / mL. A negative result does not preclude SARS-CoV-2 infection and should not be used as the  sole basis for treatment or other patient management decisions.  A negative result may occur with improper specimen collection / handling, submission of specimen other than nasopharyngeal swab, presence of viral mutation(s) within the areas targeted by this assay, and inadequate number of viral copies (<250 copies / mL). A negative result must be combined with clinical observations, patient history, and epidemiological information.  Fact Sheet for Patients:   StrictlyIdeas.no  Fact Sheet for Healthcare Providers: BankingDealers.co.za  This test is not yet approved or  cleared by the [place] FDA and has been authorized for detection and/or diagnosis of SARS-CoV-2 by FDA under  an Emergency Use Authorization (EUA).  This EUA will remain in effect (meaning this test can be used) for the duration of the COVID-19 declaration under Section 564(b)(1) of the Act, 21 U.S.C. section 360bbb-3(b)(1), unless the authorization is terminated or revoked sooner.  Performed at [facility], [address] [place], [place] [address]      Radiology Studies: CT FOOT RIGHT W CONTRAST  Result Date: 05/01/2020 CLINICAL DATA:  Right foot pain, swelling and drainage since an injury 1 year ago. Wound on the plantar surface of the foot at the second toe. EXAM: CT OF THE LOWER RIGHT EXTREMITY WITH CONTRAST TECHNIQUE: Multidetector CT imaging of the lower right extremity was performed according to the standard protocol following intravenous contrast administration. COMPARISON:  Plain films right foot 05/01/2020. CONTRAST:  75 mL OMNIPAQUE IOHEXOL 300 MG/ML  SOLN FINDINGS: Bones/Joint/Cartilage Extensive periosteal reaction is present along the most the entire second metatarsal and proximal phalanx of the second toe. Marked cortical thickening consistent with periosteal reaction is also seen throughout almost the entire third metatarsal. Bony destructive change about  the third MTP joint is identified. Mild periosteal reaction base of the proximal phalanx of the third toe is seen. Bony destructive change and irregularity at the IP joint of the great toe is identified. Margins are corticated. Ligaments Suboptimally assessed by CT. Muscles and Tendons There is marked atrophy of intrinsic musculature the foot. No intramuscular fluid collection is identified. Soft tissues Intense subcutaneous edema is present about the foot. There is a wound on the plantar surface of the foot at approximately the level of the second MTP joint. Deep to the wound, there is a fragment of bone measuring 1.3 cm transverse by 1.8 cm long by 1.5 cm craniocaudal which is likely the second metatarsal head. Soft tissue gas and marked swelling are present about the second toe. IMPRESSION: Large skin wound on the plantar surface of the foot with a bone fragment deep to it which appears to be the second metatarsal head. Findings consistent with osteomyelitis throughout almost the entire second and third metatarsals and proximal phalanges of the second and third toes. Destructive change about the second and third MTP joints is worrisome for septic joints. Destructive change about the IP joint of the great toe is likely due to prior septic joint. Margins are corticated. Electronically Signed   By: Drusilla Kanner M.D.   On: 05/01/2020 12:47    Pamella Pert, MD, PhD [facility]  Between 7 am - 7 pm I am available, please contact me via Amion or Securechat  Between 7 pm - 7 am I am not available, please contact night coverage MD/APP via Amion

## 2020-05-02 NOTE — Anesthesia Preprocedure Evaluation (Addendum)
Anesthesia Evaluation  Patient identified by MRN, date of birth, ID band Patient awake    Reviewed: Allergy & Precautions, NPO status , Patient's Chart, lab work & pertinent test results  Airway Mallampati: II  TM Distance: >3 FB Neck ROM: Full    Dental  (+) Dental Advisory Given   Pulmonary Current Smoker,    breath sounds clear to auscultation       Cardiovascular negative cardio ROS   Rhythm:Regular Rate:Normal     Neuro/Psych negative neurological ROS     GI/Hepatic negative GI ROS, Neg liver ROS,   Endo/Other  diabetes  Renal/GU negative Renal ROS     Musculoskeletal   Abdominal   Peds  Hematology  (+) anemia ,   Anesthesia Other Findings   Reproductive/Obstetrics                            Anesthesia Physical Anesthesia Plan  ASA: III  Anesthesia Plan: General   Post-op Pain Management:  Regional for Post-op pain   Induction: Intravenous  PONV Risk Score and Plan: 1 and Ondansetron, Midazolam and Treatment may vary due to age or medical condition  Airway Management Planned: LMA  Additional Equipment:   Intra-op Plan:   Post-operative Plan: Extubation in OR  Informed Consent: I have reviewed the patients History and Physical, chart, labs and discussed the procedure including the risks, benefits and alternatives for the proposed anesthesia with the patient or authorized representative who has indicated his/her understanding and acceptance.     Dental advisory given  Plan Discussed with: CRNA  Anesthesia Plan Comments:        Anesthesia Quick Evaluation

## 2020-05-02 NOTE — Consult Note (Signed)
Reason for Consult:Right foot osteomyelitis Referring Physician: EDP Jeani Hawking  [name] is an 49 y.o. male.  HPI: 49 yo male with a history of neuropathy and chronic foot problems.  He presents with increasing foot swelling and pain. Work-up of the foot up at [facility] ED showed osteo of the right foot with pathologic fracture of the second metatarsal and involvement of the third metatarsal. He was transported down to [facility] for medical optimization and definitive treatment of the right foot.   History reviewed. No pertinent past medical history.  Past Surgical History:  Procedure Laterality Date  . BACK SURGERY      History reviewed. No pertinent family history.  Social History:  reports that he has been smoking cigarettes. He has a 10.00 pack-year smoking history. He has never used smokeless tobacco. He reports current alcohol use. He reports that he does not use drugs.  Allergies:  Allergies  Allergen Reactions  . Ceftriaxone Diarrhea, Nausea Only and Other (See Comments)    Nausea, diarrhea.     Medications: I have reviewed the Wagner's current medications.  Results for orders placed or performed during the hospital encounter of 05/01/20 (from the past 48 hour(s))  Comprehensive metabolic panel     Status: Abnormal   Collection Time: 05/01/20  7:39 AM  Result Value Ref Range   Sodium 133 (L) 135 - 145 mmol/L   Potassium 3.7 3.5 - 5.1 mmol/L   Chloride 97 (L) 98 - 111 mmol/L   CO2 26 22 - 32 mmol/L   Glucose, Bld 105 (H) 70 - 99 mg/dL    Comment: Glucose reference range applies only to samples taken after fasting for at least 8 hours.   BUN 13 6 - 20 mg/dL   Creatinine, Ser 0.99 0.61 - 1.24 mg/dL   Calcium 8.4 (L) 8.9 - 10.3 mg/dL   Total Protein 8.1 6.5 - 8.1 g/dL   Albumin 3.1 (L) 3.5 - 5.0 g/dL   AST 37 15 - 41 U/L   ALT 27 0 - 44 U/L   Alkaline Phosphatase 72 38 - 126 U/L   Total Bilirubin 0.3 0.3 - 1.2 mg/dL   GFR calc non Af Amer >60  >60 mL/min   GFR calc Af Amer >60 >60 mL/min   Anion gap 10 5 - 15    Comment: Performed at [facility], [address], [place], [place] [address]  CBC with Differential     Status: Abnormal   Collection Time: 05/01/20  7:39 AM  Result Value Ref Range   WBC 7.6 4.0 - 10.5 K/uL   RBC 4.22 4.22 - 5.81 MIL/uL   Hemoglobin 10.8 (L) 13.0 - 17.0 g/dL   HCT 50.5 (L) 39 - 52 %   MCV 83.4 80.0 - 100.0 fL   MCH 25.6 (L) 26.0 - 34.0 pg   MCHC 30.7 30.0 - 36.0 g/dL   RDW 39.7 67.3 - 41.9 %   Platelets 202 150 - 400 K/uL   nRBC 0.0 0.0 - 0.2 %   Neutrophils Relative % 61 %   Neutro Abs 4.6 1.7 - 7.7 K/uL   Lymphocytes Relative 26 %   Lymphs Abs 2.0 0.7 - 4.0 K/uL   Monocytes Relative 8 %   Monocytes Absolute 0.6 0 - 1 K/uL   Eosinophils Relative 4 %   Eosinophils Absolute 0.3 0 - 0 K/uL   Basophils Relative 1 %   Basophils Absolute 0.1 0 - 0 K/uL  Immature Granulocytes 0 %   Abs Immature Granulocytes 0.02 0.00 - 0.07 K/uL    Comment: Performed at [facility], [address] [place] [address] [place], [place] [address]  Lactic acid, plasma     Status: None   Collection Time: 05/01/20  7:39 AM  Result Value Ref Range   Lactic Acid, Venous 1.2 0.5 - 1.9 mmol/L    Comment: Performed at [facility], [address] [place], [place] [address]  Culture, blood (routine x 2)     Status: None (Preliminary result)   Collection Time: 05/01/20  7:39 AM   Specimen: BLOOD  Result Value Ref Range   Specimen Description BLOOD SITE NOT SPECIFIED DRAWN BY RN    Special Requests      BOTTLES DRAWN AEROBIC AND ANAEROBIC Blood Culture results may not be optimal due to an inadequate volume of blood received in culture bottles Performed at [facility], [address], [place], [place] [address]    Culture PENDING    Report Status PENDING   Magnesium     Status: None   Collection Time: 05/01/20  7:39 AM  Result Value Ref Range   Magnesium 2.0 1.7 - 2.4 mg/dL    Comment: Performed at [facility], [address]  [place], [place] [address]  TSH     Status: None   Collection Time: 05/01/20  7:39 AM  Result Value Ref Range   TSH 1.511 0.350 - 4.500 uIU/mL    Comment: Performed by a 3rd Generation assay with a functional sensitivity of <=0.01 uIU/mL. Performed at [facility], [address], [place], [place] [address]   Hemoglobin     Status: Abnormal   Collection Time: 05/01/20  7:39 AM  Result Value Ref Range   Hemoglobin 10.9 (L) 13.0 - 17.0 g/dL    Comment: Performed at [facility], [address] [place], [place] [address]  SARS Coronavirus 2 by RT PCR (hospital order, performed in [facility] hospital lab) Nasopharyngeal Nasopharyngeal Swab     Status: None   Collection Time: 05/01/20 11:48 AM   Specimen: Nasopharyngeal Swab  Result Value Ref Range   SARS Coronavirus 2 NEGATIVE NEGATIVE    Comment: (NOTE) SARS-CoV-2 target nucleic acids are NOT DETECTED.  The SARS-CoV-2 RNA is generally detectable in upper and lower respiratory specimens during the acute phase of infection. The lowest concentration of SARS-CoV-2 viral copies James assay can detect is 250 copies / mL. A negative result does not preclude SARS-CoV-2 infection and should not be used as the sole basis for treatment or other Wagner management decisions.  A negative result may occur with improper specimen collection / handling, submission of specimen other than nasopharyngeal swab, presence of viral mutation(s) within the areas targeted by James assay, and inadequate number of viral copies (<250 copies / mL). A negative result must be combined with clinical observations, Wagner history, and epidemiological information.  Fact Sheet for Patients:   StrictlyIdeas.no  Fact Sheet for Healthcare Providers: BankingDealers.co.za  James test is not yet approved or  cleared by the [place] FDA and has been authorized for detection and/or diagnosis of SARS-CoV-2 by FDA under an Emergency  Use Authorization (EUA).  James EUA will remain in effect (meaning James test can be used) for the duration of the COVID-19 declaration under Section 564(b)(1) of the Act, 21 U.S.C. section 360bbb-3(b)(1), unless the authorization is terminated or revoked sooner.  Performed at [facility], [address] [place],  [address]     CT  FOOT RIGHT W CONTRAST  Result Date: 05/01/2020 CLINICAL DATA:  Right foot pain, swelling and drainage since an injury 1 year ago. Wound on the plantar surface of the foot at the second toe. EXAM: CT OF THE LOWER RIGHT EXTREMITY WITH CONTRAST TECHNIQUE: Multidetector CT imaging of the lower right extremity was performed according to the standard protocol following intravenous contrast administration. COMPARISON:  Plain films right foot 05/01/2020. CONTRAST:  75 mL OMNIPAQUE IOHEXOL 300 MG/ML  SOLN FINDINGS: Bones/Joint/Cartilage Extensive periosteal reaction is present along the most the entire second metatarsal and proximal phalanx of the second toe. Marked cortical thickening consistent with periosteal reaction is also seen throughout almost the entire third metatarsal. Bony destructive change about the third MTP joint is identified. Mild periosteal reaction base of the proximal phalanx of the third toe is seen. Bony destructive change and irregularity at the IP joint of the great toe is identified. Margins are corticated. Ligaments Suboptimally assessed by CT. Muscles and Tendons There is marked atrophy of intrinsic musculature the foot. No intramuscular fluid collection is identified. Soft tissues Intense subcutaneous edema is present about the foot. There is a wound on the plantar surface of the foot at approximately the level of the second MTP joint. Deep to the wound, there is a fragment of bone measuring 1.3 cm transverse by 1.8 cm long by 1.5 cm craniocaudal which is likely the second metatarsal head. Soft tissue gas and marked swelling are present about the  second toe. IMPRESSION: Large skin wound on the plantar surface of the foot with a bone fragment deep to it which appears to be the second metatarsal head. Findings consistent with osteomyelitis throughout almost the entire second and third metatarsals and proximal phalanges of the second and third toes. Destructive change about the second and third MTP joints is worrisome for septic joints. Destructive change about the IP joint of the great toe is likely due to prior septic joint. Margins are corticated. Electronically Signed   By: Drusilla Kanner M.D.   On: 05/01/2020 12:47   US Venous Img Lower Bilateral  Result Date: 05/01/2020 CLINICAL DATA:  Bilateral lower extremity edema and right foot erythema. EXAM: BILATERAL LOWER EXTREMITY VENOUS DOPPLER ULTRASOUND TECHNIQUE: Gray-scale sonography with graded compression, as well as color Doppler and duplex ultrasound were performed to evaluate the lower extremity deep venous systems from the level of the common femoral vein and including the common femoral, femoral, profunda femoral, popliteal and calf veins including the posterior tibial, peroneal and gastrocnemius veins when visible. The superficial great saphenous vein was also interrogated. Spectral Doppler was utilized to evaluate flow at rest and with distal augmentation maneuvers in the common femoral, femoral and popliteal veins. COMPARISON:  Prior right lower extremity study on 03/06/2019 FINDINGS: RIGHT LOWER EXTREMITY Common Femoral Vein: No evidence of thrombus. Normal compressibility, respiratory phasicity and response to augmentation. Saphenofemoral Junction: No evidence of thrombus. Normal compressibility and flow on color Doppler imaging. Profunda Femoral Vein: No evidence of thrombus. Normal compressibility and flow on color Doppler imaging. Femoral Vein: No evidence of thrombus. Normal compressibility, respiratory phasicity and response to augmentation. Popliteal Vein: No evidence of thrombus.  Normal compressibility, respiratory phasicity and response to augmentation. Calf Veins: No evidence of thrombus. Normal compressibility and flow on color Doppler imaging. Superficial Great Saphenous Vein: No evidence of thrombus. Normal compressibility. Venous Reflux:  None. Other Findings: Stable appearance by ultrasound of mildly prominent right inguinal lymph nodes. No evidence of superficial thrombophlebitis. No abnormal fluid collections. LEFT  LOWER EXTREMITY Common Femoral Vein: No evidence of thrombus. Normal compressibility, respiratory phasicity and response to augmentation. Saphenofemoral Junction: No evidence of thrombus. Normal compressibility and flow on color Doppler imaging. Profunda Femoral Vein: No evidence of thrombus. Normal compressibility and flow on color Doppler imaging. Femoral Vein: No evidence of thrombus. Normal compressibility, respiratory phasicity and response to augmentation. Popliteal Vein: No evidence of thrombus. Normal compressibility, respiratory phasicity and response to augmentation. Calf Veins: No evidence of thrombus. Normal compressibility and flow on color Doppler imaging. Superficial Great Saphenous Vein: No evidence of thrombus. Normal compressibility. Venous Reflux:  None. Other Findings: No evidence of superficial thrombophlebitis or abnormal fluid collection. IMPRESSION: No evidence of deep venous thrombosis in either lower extremity. Electronically Signed   By: Irish Lack M.D.   On: 05/01/2020 09:42   DG Foot Complete Right  Result Date: 05/01/2020 CLINICAL DATA:  Follow-up right foot ulcer with increased swelling and drainage EXAM: RIGHT FOOT COMPLETE - 3+ VIEW COMPARISON:  03/06/2019 FINDINGS: Considerable soft tissue swelling is noted in the right foot particularly within the second digit. Increased sclerosis in the second proximal phalanx is noted. There is interval fracture through the distal aspect of the second metatarsal with displacement of the  metatarsal head. James is new from the prior exam. Additionally some callus formation is noted as well as bony destructive change. Deformity of the third metatarsal is noted as well. Previously seen soft tissue wound is not as well appreciated. IMPRESSION: Changes consistent with interval fracture in the distal aspect of the second metatarsal. Associated soft tissue swelling and bony erosive changes are noted highly suspicious for osteomyelitis. MRI would be helpful for further evaluation. Chronic deformity of the third metatarsal is seen which may be related to prior fracture and healing. Electronically Signed   By: Alcide Clever M.D.   On: 05/01/2020 08:19    Review of Systems Blood pressure 132/75, pulse 65, temperature 98.5 F (36.9 C), temperature source Oral, resp. rate 16, height 6' (1.829 m), weight 117.9 kg, SpO2 100 %. Physical Exam Awake and alert, NAD Right leg with swelling and pitting edema below the knee. Foul smell and active drainage from the base of the second toe and a ulcer on the plantar aspect of the foot.  No lesions on the left foot or leg.  Pain free PROM and AROM of both hips and both knees. No red streaking proximally.   Assessment/Plan: Right foot osteo chronic. Discussed with Dr Lajoyce Corners who agreed to see [name] for likely BKA.  Recommend broad spectrum IV abx now and daily dry dressing changes .  Appreciate Dr Audrie Lia expertise and care   Verlee Rossetti 05/02/2020, 12:33 AM

## 2020-05-02 NOTE — H&P (View-Only) (Signed)
ORTHOPAEDIC CONSULTATION  REQUESTING PHYSICIAN: Leatha Gilding, MD  Chief Complaint: Pain swelling cellulitis and purulent drainage right foot.  HPI: [name] is a 49 y.o. male who presents with chronic ulceration swelling and purulent drainage right foot.  Patient states that he has had symptoms for approximately 6 months.  He states he has had on and off purulent drainage at this time he complains of pain swelling and persistent purulent drainage right foot.  History reviewed. No pertinent past medical history. Past Surgical History:  Procedure Laterality Date  . BACK SURGERY     Social History   Socioeconomic History  . Marital status: Married    Spouse name: Not on file  . Number of children: Not on file  . Years of education: Not on file  . Highest education level: Not on file  Occupational History  . Not on file  Tobacco Use  . Smoking status: Current Every Day Smoker    Packs/day: 0.50    Years: 20.00    Pack years: 10.00    Types: Cigarettes  . Smokeless tobacco: Never Used  Substance and Sexual Activity  . Alcohol use: Yes  . Drug use: Never  . Sexual activity: Not on file  Other Topics Concern  . Not on file  Social History Narrative  . Not on file   Social Determinants of Health   Financial Resource Strain:   . Difficulty of Paying Living Expenses:   Food Insecurity:   . Worried About Programme researcher, broadcasting/film/video in the Last Year:   . Barista in the Last Year:   Transportation Needs:   . Freight forwarder (Medical):   Marland Kitchen Lack of Transportation (Non-Medical):   Physical Activity:   . Days of Exercise per Week:   . Minutes of Exercise per Session:   Stress:   . Feeling of Stress :   Social Connections:   . Frequency of Communication with Friends and Family:   . Frequency of Social Gatherings with Friends and Family:   . Attends Religious Services:   . Active Member of Clubs or Organizations:   . Attends Banker  Meetings:   Marland Kitchen Marital Status:    History reviewed. No pertinent family history. - negative except otherwise stated in the family history section Allergies  Allergen Reactions  . Ceftriaxone Diarrhea, Nausea Only and Other (See Comments)    Nausea, diarrhea.    Prior to Admission medications   Not on File   CT FOOT RIGHT W CONTRAST  Result Date: 05/01/2020 CLINICAL DATA:  Right foot pain, swelling and drainage since an injury 1 year ago. Wound on the plantar surface of the foot at the second toe. EXAM: CT OF THE LOWER RIGHT EXTREMITY WITH CONTRAST TECHNIQUE: Multidetector CT imaging of the lower right extremity was performed according to the standard protocol following intravenous contrast administration. COMPARISON:  Plain films right foot 05/01/2020. CONTRAST:  75 mL OMNIPAQUE IOHEXOL 300 MG/ML  SOLN FINDINGS: Bones/Joint/Cartilage Extensive periosteal reaction is present along the most the entire second metatarsal and proximal phalanx of the second toe. Marked cortical thickening consistent with periosteal reaction is also seen throughout almost the entire third metatarsal. Bony destructive change about the third MTP joint is identified. Mild periosteal reaction base of the proximal phalanx of the third toe is seen. Bony destructive change and irregularity at the IP joint of the great toe is identified. Margins are corticated. Ligaments Suboptimally assessed by CT. Muscles  and Tendons There is marked atrophy of intrinsic musculature the foot. No intramuscular fluid collection is identified. Soft tissues Intense subcutaneous edema is present about the foot. There is a wound on the plantar surface of the foot at approximately the level of the second MTP joint. Deep to the wound, there is a fragment of bone measuring 1.3 cm transverse by 1.8 cm long by 1.5 cm craniocaudal which is likely the second metatarsal head. Soft tissue gas and marked swelling are present about the second toe. IMPRESSION: Large  skin wound on the plantar surface of the foot with a bone fragment deep to it which appears to be the second metatarsal head. Findings consistent with osteomyelitis throughout almost the entire second and third metatarsals and proximal phalanges of the second and third toes. Destructive change about the second and third MTP joints is worrisome for septic joints. Destructive change about the IP joint of the great toe is likely due to prior septic joint. Margins are corticated. Electronically Signed   By: Drusilla Kanner M.D.   On: 05/01/2020 12:47   US Venous Img Lower Bilateral  Result Date: 05/01/2020 CLINICAL DATA:  Bilateral lower extremity edema and right foot erythema. EXAM: BILATERAL LOWER EXTREMITY VENOUS DOPPLER ULTRASOUND TECHNIQUE: Gray-scale sonography with graded compression, as well as color Doppler and duplex ultrasound were performed to evaluate the lower extremity deep venous systems from the level of the common femoral vein and including the common femoral, femoral, profunda femoral, popliteal and calf veins including the posterior tibial, peroneal and gastrocnemius veins when visible. The superficial great saphenous vein was also interrogated. Spectral Doppler was utilized to evaluate flow at rest and with distal augmentation maneuvers in the common femoral, femoral and popliteal veins. COMPARISON:  Prior right lower extremity study on 03/06/2019 FINDINGS: RIGHT LOWER EXTREMITY Common Femoral Vein: No evidence of thrombus. Normal compressibility, respiratory phasicity and response to augmentation. Saphenofemoral Junction: No evidence of thrombus. Normal compressibility and flow on color Doppler imaging. Profunda Femoral Vein: No evidence of thrombus. Normal compressibility and flow on color Doppler imaging. Femoral Vein: No evidence of thrombus. Normal compressibility, respiratory phasicity and response to augmentation. Popliteal Vein: No evidence of thrombus. Normal compressibility,  respiratory phasicity and response to augmentation. Calf Veins: No evidence of thrombus. Normal compressibility and flow on color Doppler imaging. Superficial Great Saphenous Vein: No evidence of thrombus. Normal compressibility. Venous Reflux:  None. Other Findings: Stable appearance by ultrasound of mildly prominent right inguinal lymph nodes. No evidence of superficial thrombophlebitis. No abnormal fluid collections. LEFT LOWER EXTREMITY Common Femoral Vein: No evidence of thrombus. Normal compressibility, respiratory phasicity and response to augmentation. Saphenofemoral Junction: No evidence of thrombus. Normal compressibility and flow on color Doppler imaging. Profunda Femoral Vein: No evidence of thrombus. Normal compressibility and flow on color Doppler imaging. Femoral Vein: No evidence of thrombus. Normal compressibility, respiratory phasicity and response to augmentation. Popliteal Vein: No evidence of thrombus. Normal compressibility, respiratory phasicity and response to augmentation. Calf Veins: No evidence of thrombus. Normal compressibility and flow on color Doppler imaging. Superficial Great Saphenous Vein: No evidence of thrombus. Normal compressibility. Venous Reflux:  None. Other Findings: No evidence of superficial thrombophlebitis or abnormal fluid collection. IMPRESSION: No evidence of deep venous thrombosis in either lower extremity. Electronically Signed   By: Irish Lack M.D.   On: 05/01/2020 09:42   DG Foot Complete Right  Result Date: 05/01/2020 CLINICAL DATA:  Follow-up right foot ulcer with increased swelling and drainage EXAM: RIGHT FOOT COMPLETE - 3+  VIEW COMPARISON:  03/06/2019 FINDINGS: Considerable soft tissue swelling is noted in the right foot particularly within the second digit. Increased sclerosis in the second proximal phalanx is noted. There is interval fracture through the distal aspect of the second metatarsal with displacement of the metatarsal head. This is new  from the prior exam. Additionally some callus formation is noted as well as bony destructive change. Deformity of the third metatarsal is noted as well. Previously seen soft tissue wound is not as well appreciated. IMPRESSION: Changes consistent with interval fracture in the distal aspect of the second metatarsal. Associated soft tissue swelling and bony erosive changes are noted highly suspicious for osteomyelitis. MRI would be helpful for further evaluation. Chronic deformity of the third metatarsal is seen which may be related to prior fracture and healing. Electronically Signed   By: Inez Catalina M.D.   On: 05/01/2020 08:19   - pertinent xrays, CT, MRI studies were reviewed and independently interpreted  Positive ROS: All other systems have been reviewed and were otherwise negative with the exception of those mentioned in the HPI and as above.  Physical Exam: General: Alert, no acute distress Psychiatric: Patient is competent for consent with normal mood and affect Lymphatic: No axillary or cervical lymphadenopathy Cardiovascular: No pedal edema Respiratory: No cyanosis, no use of accessory musculature GI: No organomegaly, abdomen is soft and non-tender    Images:  @ENCIMAGES @  Labs:  Lab Results  Component Value Date   ESRSEDRATE 10 03/06/2019   CRP 3.2 (H) 03/06/2019   REPTSTATUS PENDING 05/01/2020   CULT  05/01/2020    NO GROWTH < 24 HOURS Performed at [facility], [address], [place], [place] [address]     Lab Results  Component Value Date   ALBUMIN 3.1 (L) 05/01/2020   ALBUMIN 3.6 03/06/2019    Neurologic: Patient does not have protective sensation bilateral lower extremities.   MUSCULOSKELETAL:   Skin: Examination patient has swelling and cellulitis of the right foot he is painful to palpation forefoot midfoot and hindfoot.  I cannot palpate a pulse secondary to the swelling.  There is purulent drainage from the plantar aspect of his foot as well as sausage  digit swelling of the second toe.  Patient has destructive lytic changes of the second metatarsal consistent with chronic osteomyelitis as well as fibrous bony changes to the entire third metatarsal also consistent with chronic osteomyelitis.  There is a plantar draining ulcer with purulent drainage.  Assessment: Assessment: Chronic osteomyelitis of the second and third metatarsals right foot with cellulitis and infection extending into the hindfoot.  Plan: Plan: We will plan for a transtibial amputation.  Risks and benefits were discussed including risk of the wound not healing need for additional surgery.  Patient states he understands wished to proceed at this time we will plan for surgery tomorrow morning Sunday.  Thank you for the consult and the opportunity to see [name], [name] [phone] 8:52 AM

## 2020-05-03 ENCOUNTER — Encounter (HOSPITAL_COMMUNITY)
Admission: AD | Disposition: A | Discharge status: Home Health | Payer: Self-pay | Source: Home / Self Care | Attending: Internal Medicine

## 2020-05-03 ENCOUNTER — Encounter (HOSPITAL_COMMUNITY): Payer: Self-pay | Admitting: Family Medicine

## 2020-05-03 ENCOUNTER — Inpatient Hospital Stay (HOSPITAL_COMMUNITY): Payer: Medicaid Other | Admitting: Anesthesiology

## 2020-05-03 HISTORY — PX: AMPUTATION: SHX166

## 2020-05-03 LAB — CBC
HCT: 39 % (ref 39.0–52.0)
Hemoglobin: 12.5 g/dL — ABNORMAL LOW (ref 13.0–17.0)
MCH: 25.6 pg — ABNORMAL LOW (ref 26.0–34.0)
MCHC: 32.1 g/dL (ref 30.0–36.0)
MCV: 79.8 fL — ABNORMAL LOW (ref 80.0–100.0)
Platelets: 198 10*3/uL (ref 150–400)
RBC: 4.89 MIL/uL (ref 4.22–5.81)
RDW: 14.6 % (ref 11.5–15.5)
WBC: 6.6 10*3/uL (ref 4.0–10.5)
nRBC: 0 % (ref 0.0–0.2)

## 2020-05-03 LAB — IRON AND TIBC
Iron: 29 ug/dL — ABNORMAL LOW (ref 45–182)
Saturation Ratios: 7 % — ABNORMAL LOW (ref 17.9–39.5)
TIBC: 426 ug/dL (ref 250–450)
UIBC: 397 ug/dL

## 2020-05-03 LAB — FOLATE: Folate: 8 ng/mL (ref >5.9)

## 2020-05-03 LAB — COMPREHENSIVE METABOLIC PANEL
ALT: 24 U/L (ref 0–44)
AST: 29 U/L (ref 15–41)
Albumin: 2.7 g/dL — ABNORMAL LOW (ref 3.5–5.0)
Alkaline Phosphatase: 62 U/L (ref 38–126)
Anion gap: 11 (ref 5–15)
BUN: 6 mg/dL (ref 6–20)
CO2: 25 mmol/L (ref 22–32)
Calcium: 8.9 mg/dL (ref 8.9–10.3)
Chloride: 101 mmol/L (ref 98–111)
Creatinine, Ser: 0.82 mg/dL (ref 0.61–1.24)
GFR calc Af Amer: >60 mL/min (ref >60)
GFR calc non Af Amer: >60 mL/min (ref >60)
Glucose, Bld: 102 mg/dL — ABNORMAL HIGH (ref 70–99)
Potassium: 3.7 mmol/L (ref 3.5–5.1)
Sodium: 137 mmol/L (ref 135–145)
Total Bilirubin: 0.6 mg/dL (ref 0.3–1.2)
Total Protein: 7.6 g/dL (ref 6.5–8.1)

## 2020-05-03 LAB — VITAMIN B12: Vitamin B-12: 426 pg/mL (ref 180–914)

## 2020-05-03 LAB — RETICULOCYTES
Immature Retic Fract: 11.9 % (ref 2.3–15.9)
RBC.: 4.7 MIL/uL (ref 4.22–5.81)
Retic Count, Absolute: 67.7 10*3/uL (ref 19.0–186.0)
Retic Ct Pct: 1.4 % (ref 0.4–3.1)

## 2020-05-03 LAB — HEMOGLOBIN A1C
Hgb A1c MFr Bld: 5.9 % — ABNORMAL HIGH (ref 4.8–5.6)
Mean Plasma Glucose: 122.63 mg/dL

## 2020-05-03 LAB — GLUCOSE, CAPILLARY: Glucose-Capillary: 104 mg/dL — ABNORMAL HIGH (ref 70–99)

## 2020-05-03 LAB — FERRITIN: Ferritin: 34 ng/mL (ref 24–336)

## 2020-05-03 SURGERY — AMPUTATION BELOW KNEE
Anesthesia: General | Site: Leg Lower | Laterality: Right

## 2020-05-03 MED ORDER — METHOCARBAMOL 500 MG PO TABS
ORAL_TABLET | ORAL | Status: AC
  Administered 2020-05-03: 500 mg via ORAL
  Filled 2020-05-03: qty 1

## 2020-05-03 MED ORDER — SODIUM CHLORIDE 0.9 % IV SOLN: INTRAVENOUS | Status: DC

## 2020-05-03 MED ORDER — HYDROMORPHONE HCL 1 MG/ML IJ SOLN
INTRAMUSCULAR | Status: AC
  Administered 2020-05-03: 0.5 mg via INTRAVENOUS
  Filled 2020-05-03: qty 1

## 2020-05-03 MED ORDER — METHOCARBAMOL 1000 MG/10ML IJ SOLN
500.0000 mg | Freq: Four times a day (QID) | INTRAVENOUS | Status: DC | PRN
  Administered 2020-05-04: 500 mg via INTRAVENOUS
  Filled 2020-05-03 (×2): qty 5

## 2020-05-03 MED ORDER — POLYETHYLENE GLYCOL 3350 17 G PO PACK: 17.0000 g | PACK | Freq: Every day | ORAL | Status: DC | PRN

## 2020-05-03 MED ORDER — METHOCARBAMOL 500 MG PO TABS
500.0000 mg | ORAL_TABLET | Freq: Four times a day (QID) | ORAL | Status: DC | PRN
  Administered 2020-05-03 – 2020-05-06 (×3): 500 mg via ORAL
  Filled 2020-05-03 (×3): qty 1

## 2020-05-03 MED ORDER — BUPIVACAINE HCL (PF) 0.25 % IJ SOLN
INTRAMUSCULAR | Status: DC | PRN
Start: 2020-05-03 — End: 2020-05-03
  Administered 2020-05-03: 20 mL

## 2020-05-03 MED ORDER — METOCLOPRAMIDE HCL 5 MG/ML IJ SOLN: 5.0000 mg | Freq: Three times a day (TID) | INTRAMUSCULAR | Status: DC | PRN

## 2020-05-03 MED ORDER — BISACODYL 10 MG RE SUPP: 10.0000 mg | Freq: Every day | RECTAL | Status: DC | PRN

## 2020-05-03 MED ORDER — FENTANYL CITRATE (PF) 250 MCG/5ML IJ SOLN
INTRAMUSCULAR | Status: AC
  Filled 2020-05-03: qty 5

## 2020-05-03 MED ORDER — LACTATED RINGERS IV SOLN: INTRAVENOUS | Status: DC | PRN

## 2020-05-03 MED ORDER — HYDROMORPHONE HCL 1 MG/ML IJ SOLN
2.0000 mg | Freq: Once | INTRAMUSCULAR | Status: AC
  Administered 2020-05-03: 2 mg via INTRAVENOUS
  Filled 2020-05-03: qty 2

## 2020-05-03 MED ORDER — PROPOFOL 10 MG/ML IV BOLUS
INTRAVENOUS | Status: DC | PRN
  Administered 2020-05-03: 200 mg via INTRAVENOUS

## 2020-05-03 MED ORDER — MIDAZOLAM HCL 5 MG/5ML IJ SOLN
INTRAMUSCULAR | Status: DC | PRN
  Administered 2020-05-03: 2 mg via INTRAVENOUS

## 2020-05-03 MED ORDER — PROPOFOL 10 MG/ML IV BOLUS
INTRAVENOUS | Status: AC
  Filled 2020-05-03: qty 20

## 2020-05-03 MED ORDER — PHENYLEPHRINE HCL (PRESSORS) 10 MG/ML IV SOLN
INTRAVENOUS | Status: DC | PRN
Start: 2020-05-03 — End: 2020-05-03
  Administered 2020-05-03 (×2): 80 ug via INTRAVENOUS

## 2020-05-03 MED ORDER — ONDANSETRON HCL 4 MG/2ML IJ SOLN
INTRAMUSCULAR | Status: DC | PRN
  Administered 2020-05-03: 4 mg via INTRAVENOUS

## 2020-05-03 MED ORDER — MIDAZOLAM HCL 2 MG/2ML IJ SOLN
INTRAMUSCULAR | Status: AC
  Filled 2020-05-03: qty 2

## 2020-05-03 MED ORDER — OXYCODONE HCL 5 MG PO TABS
10.0000 mg | ORAL_TABLET | ORAL | Status: DC | PRN
  Administered 2020-05-03 (×2): 15 mg via ORAL
  Filled 2020-05-03 (×2): qty 3

## 2020-05-03 MED ORDER — OXYCODONE HCL 5 MG PO TABS: 5.0000 mg | ORAL_TABLET | ORAL | Status: DC | PRN

## 2020-05-03 MED ORDER — HYDROMORPHONE HCL 1 MG/ML IJ SOLN
1.0000 mg | INTRAMUSCULAR | Status: DC | PRN
  Administered 2020-05-03 (×4): 1 mg via INTRAVENOUS
  Filled 2020-05-03 (×5): qty 1

## 2020-05-03 MED ORDER — ONDANSETRON HCL 4 MG PO TABS: 4.0000 mg | ORAL_TABLET | Freq: Four times a day (QID) | ORAL | Status: DC | PRN

## 2020-05-03 MED ORDER — METOCLOPRAMIDE HCL 5 MG PO TABS: 5.0000 mg | ORAL_TABLET | Freq: Three times a day (TID) | ORAL | Status: DC | PRN

## 2020-05-03 MED ORDER — ONDANSETRON HCL 4 MG/2ML IJ SOLN: 4.0000 mg | Freq: Four times a day (QID) | INTRAMUSCULAR | Status: DC | PRN

## 2020-05-03 MED ORDER — DOCUSATE SODIUM 100 MG PO CAPS
100.0000 mg | ORAL_CAPSULE | Freq: Two times a day (BID) | ORAL | Status: DC
  Administered 2020-05-03 – 2020-05-06 (×7): 100 mg via ORAL
  Filled 2020-05-03 (×7): qty 1

## 2020-05-03 MED ORDER — 0.9 % SODIUM CHLORIDE (POUR BTL) OPTIME
TOPICAL | Status: DC | PRN
  Administered 2020-05-03: 1000 mL

## 2020-05-03 MED ORDER — OXYCODONE HCL 5 MG PO TABS
ORAL_TABLET | ORAL | Status: AC
  Administered 2020-05-03: 5 mg via ORAL
  Filled 2020-05-03: qty 1

## 2020-05-03 MED ORDER — FENTANYL CITRATE (PF) 250 MCG/5ML IJ SOLN
INTRAMUSCULAR | Status: DC | PRN
  Administered 2020-05-03 (×4): 50 ug via INTRAVENOUS

## 2020-05-03 MED ORDER — ROPIVACAINE HCL 5 MG/ML IJ SOLN
INTRAMUSCULAR | Status: DC | PRN
  Administered 2020-05-03: 20 mL via PERINEURAL

## 2020-05-03 MED ORDER — LIDOCAINE 2% (20 MG/ML) 5 ML SYRINGE
INTRAMUSCULAR | Status: DC | PRN
  Administered 2020-05-03: 20 mg via INTRAVENOUS

## 2020-05-03 MED ORDER — MAGNESIUM CITRATE PO SOLN: 1.0000 | Freq: Once | ORAL | Status: DC | PRN

## 2020-05-03 MED ORDER — ACETAMINOPHEN 325 MG PO TABS
325.0000 mg | ORAL_TABLET | Freq: Four times a day (QID) | ORAL | Status: DC | PRN
  Administered 2020-05-05: 650 mg via ORAL
  Filled 2020-05-03: qty 2

## 2020-05-03 MED ORDER — HYDROMORPHONE HCL 1 MG/ML IJ SOLN
0.5000 mg | INTRAMUSCULAR | Status: DC | PRN
  Administered 2020-05-03: 0.5 mg via INTRAVENOUS

## 2020-05-03 SURGICAL SUPPLY — 39 items
BLADE SAW RECIP 87.9 MT (BLADE) ×3 IMPLANT
BLADE SURG 21 STRL SS (BLADE) ×3 IMPLANT
BNDG COHESIVE 6X5 TAN STRL LF (GAUZE/BANDAGES/DRESSINGS) ×2 IMPLANT
CANISTER WOUND CARE 500ML ATS (WOUND CARE) ×3 IMPLANT
COVER SURGICAL LIGHT HANDLE (MISCELLANEOUS) ×3 IMPLANT
COVER WAND RF STERILE (DRAPES) IMPLANT
CUFF TOURN SGL QUICK 34 (TOURNIQUET CUFF)
CUFF TRNQT CYL 34X4.125X (TOURNIQUET CUFF) ×1 IMPLANT
DRAPE INCISE IOBAN 66X45 STRL (DRAPES) ×3 IMPLANT
DRAPE U-SHAPE 47X51 STRL (DRAPES) ×3 IMPLANT
DRESSING PREVENA PLUS CUSTOM (GAUZE/BANDAGES/DRESSINGS) ×1 IMPLANT
DRSG PREVENA PLUS CUSTOM (GAUZE/BANDAGES/DRESSINGS) ×6
DURAPREP 26ML APPLICATOR (WOUND CARE) ×3 IMPLANT
ELECT REM PT RETURN 9FT ADLT (ELECTROSURGICAL) ×3
ELECTRODE REM PT RTRN 9FT ADLT (ELECTROSURGICAL) ×1 IMPLANT
GLOVE BIOGEL PI IND STRL 9 (GLOVE) ×1 IMPLANT
GLOVE BIOGEL PI INDICATOR 9 (GLOVE) ×2
GLOVE SURG ORTHO 9.0 STRL STRW (GLOVE) ×3 IMPLANT
GOWN STRL REUS W/ TWL XL LVL3 (GOWN DISPOSABLE) ×2 IMPLANT
GOWN STRL REUS W/TWL XL LVL3 (GOWN DISPOSABLE) ×6
KIT BASIN OR (CUSTOM PROCEDURE TRAY) ×3 IMPLANT
KIT TURNOVER KIT B (KITS) ×3 IMPLANT
MANIFOLD NEPTUNE II (INSTRUMENTS) ×3 IMPLANT
NS IRRIG 1000ML POUR BTL (IV SOLUTION) ×3 IMPLANT
PACK ORTHO EXTREMITY (CUSTOM PROCEDURE TRAY) ×3 IMPLANT
PAD ARMBOARD 7.5X6 YLW CONV (MISCELLANEOUS) ×3 IMPLANT
PREVENA RESTOR ARTHOFORM 46X30 (CANNISTER) ×5 IMPLANT
PREVENA RESTOR AXIOFORM 29X28 (GAUZE/BANDAGES/DRESSINGS) ×2 IMPLANT
SPONGE LAP 18X18 RF (DISPOSABLE) ×2 IMPLANT
STAPLER VISISTAT 35W (STAPLE) ×2 IMPLANT
STOCKINETTE IMPERVIOUS LG (DRAPES) ×3 IMPLANT
SUT ETHILON 2 0 PSLX (SUTURE) IMPLANT
SUT SILK 2 0 (SUTURE) ×3
SUT SILK 2-0 18XBRD TIE 12 (SUTURE) ×1 IMPLANT
SUT VIC AB 1 CTX 27 (SUTURE) ×6 IMPLANT
TOWEL GREEN STERILE (TOWEL DISPOSABLE) ×3 IMPLANT
TUBE CONNECTING 12'X1/4 (SUCTIONS) ×1
TUBE CONNECTING 12X1/4 (SUCTIONS) ×2 IMPLANT
YANKAUER SUCT BULB TIP NO VENT (SUCTIONS) ×3 IMPLANT

## 2020-05-03 NOTE — Interval H&P Note (Signed)
History and Physical Interval Note:  05/03/2020 7:43 AM  [name]  has presented today for surgery, with the diagnosis of OSTEOMYELITIS RIGHT FOOT.  The various methods of treatment have been discussed with the patient and family. After consideration of risks, benefits and other options for treatment, the patient has consented to  Procedure(s): AMPUTATION BELOW KNEE (Right) as a surgical intervention.  The patient's history has been reviewed, patient examined, no change in status, stable for surgery.  I have reviewed the patient's chart and labs.  Questions were answered to the patient's satisfaction.     Nadara Mustard

## 2020-05-03 NOTE — Op Note (Signed)
   Date of Surgery: 05/03/2020  INDICATIONS: [name] is a 49 y.o.-year-old male who has chronic osteomyelitis right foot.  PREOPERATIVE DIAGNOSIS: osteomyelitis right foot  POSTOPERATIVE DIAGNOSIS: Same.  PROCEDURE: Transtibial amputation Application of Prevena wound VAC  SURGEON: Lajoyce Corners, M.D.  ANESTHESIA:  general  IV FLUIDS AND URINE: See anesthesia.  ESTIMATED BLOOD LOSS: 100 mL.  COMPLICATIONS: None.  DESCRIPTION OF PROCEDURE: The patient was brought to the operating room and underwent a general anesthetic. After adequate levels of anesthesia were obtained patient's lower extremity was prepped using DuraPrep draped into a sterile field. A timeout was called. The foot was draped out of the sterile field with impervious stockinette. A transverse incision was made 11 cm distal to the tibial tubercle. This curved proximally and a large posterior flap was created. The tibia was transected 1 cm proximal to the skin incision. The fibula was transected just proximal to the tibial incision. The tibia was beveled anteriorly. A large posterior flap was created. The sciatic nerve was pulled cut and allowed to retract. The vascular bundles were suture ligated with 2-0 silk. The deep and superficial fascial layers were closed using #1 Vicryl. The skin was closed using staples and 2-0 nylon. The wound was covered with a Prevena wound VAC. There was a good suction fit. A prosthetic shrinker was applied. Patient was extubated taken to the PACU in stable condition.   DISCHARGE PLANNING:  Antibiotic duration:continue antibiotics for 24 hours  Weightbearing: NWB right  Pain medication: opoid pathway  Dressing care/ Wound BTD:VVOHY vac in place  Discharge to: SNF  Follow-up: In the office 1 week post operative.  [name], MD [facility] 8:37 AM

## 2020-05-03 NOTE — Anesthesia Procedure Notes (Signed)
Anesthesia Regional Block: Popliteal block   Pre-Anesthetic Checklist: ,, timeout performed, Correct Patient, Correct Site, Correct Laterality, Correct Procedure, Correct Position, site marked, Risks and benefits discussed,  Surgical consent,  Pre-op evaluation,  At surgeon's request and post-op pain management  Laterality: Right  Prep: chloraprep       Needles:  Injection technique: Single-shot  Needle Type: Echogenic Needle     Needle Length: 9cm  Needle Gauge: 21     Additional Needles:   Procedures:,,,, ultrasound used (permanent image in chart),,,,  Narrative:  Start time: 05/03/2020 7:08 AM End time: 05/03/2020 7:15 AM Injection made incrementally with aspirations every 5 mL.  Performed by: Personally  Anesthesiologist: Marcene Duos, MD

## 2020-05-03 NOTE — Progress Notes (Signed)
PROGRESS NOTE  [name] XBD:[id number] DOB: 1970/12/12 DOA: 05/01/2020 PCP: Patient, No Pcp Per   LOS: 2 days   Brief Narrative / Interim history: 49 year old male with history of obesity, tobacco use, chronic back pain who came into the ED due to worsening swelling, pain and drainage out of his right foot.  Patient has been having problems with the right foot on and off for the past couple years, at one point he was following with podiatry, however in the last 2 weeks he appreciates that he developed a new drainage and swelling and decided to come to the hospital.  Patient was placed on IV antibiotics, CT scan of the foot showed extensive osteomyelitis/tissue infection, concern for septic arthritis.  Orthopedic surgery was consulted.  Subjective / 24h Interval events: Seen after the surgery, moaning in pain  Assessment & Plan: Principal Problem Right foot osteomyelitis-patient was started on antibiotics, continue, appreciate orthopedic surgery consultation.  I have discussed his case with Dr. Lajoyce Corners he is status post BKA on 6/27.  Continue to monitor cultures.  Pain control, PT tomorrow  Active Problems Obesity-BMI 35.2, he will need to lose weight which will also help when he will be fitted with a right foot prosthesis  Chronic back pain-continue as needed analgesics  Nicotine abuse-he seems determined to quit, continue nicotine patch  Anemia, iron deficiency-start iron supplementation  Hyperglycemia-no history of diabetes, A1c suggests prediabetes  Scheduled Meds: . docusate sodium  100 mg Oral BID  . nicotine  14 mg Transdermal Daily   Continuous Infusions: . sodium chloride    . methocarbamol (ROBAXIN) IV    . piperacillin-tazobactam (ZOSYN)  IV 3.375 g (05/03/20 0222)  . vancomycin 1,250 mg (05/02/20 2321)   PRN Meds:.[DISCONTINUED] acetaminophen **OR** acetaminophen, [START ON 05/04/2020] acetaminophen, bisacodyl, HYDROmorphone (DILAUDID) injection, magnesium  citrate, methocarbamol **OR** methocarbamol (ROBAXIN) IV, metoCLOPramide **OR** metoCLOPramide (REGLAN) injection, ondansetron **OR** ondansetron (ZOFRAN) IV, oxyCODONE, oxyCODONE, polyethylene glycol  Diet Orders (From admission, onward)    Start     Ordered   05/03/20 0917  Diet Carb Modified Fluid consistency: Thin; Room service appropriate? Yes  Diet effective now       Question Answer Comment  Calorie Level Medium 1600-2000   Fluid consistency: Thin   Room service appropriate? Yes      05/03/20 0916          DVT prophylaxis: SCDs Start: 05/03/20 0917SCDs    Code Status: Full Code  Family Communication: no family at bedside   Status is: Inpatient  Remains inpatient appropriate because:Ongoing diagnostic testing needed not appropriate for outpatient work up and Inpatient level of care appropriate due to severity of illness  Dispo:  Patient From: Home  Planned Disposition: Home  Expected discharge date: 05/06/20  Medically stable for discharge: No  Consultants:  Orthopedic surgery   Procedures:  BKA 6/27  Microbiology  None   Antimicrobials: Vancomycin 6/25 >> Zosyn 6/25 >>   Objective: Vitals:   05/03/20 0409 05/03/20 0840 05/03/20 0855 05/03/20 0919  BP: 130/89 (!) 111/54 109/74 120/74  Pulse: 68 69 73 67  Resp: 18 12 13 20   Temp: 98 F (36.7 C) 97.9 F (36.6 C) 98.2 F (36.8 C) 98.2 F (36.8 C)  TempSrc: Oral   Oral  SpO2: 98% 100% 100% 94%  Weight:      Height:        Intake/Output Summary (Last 24 hours) at 05/03/2020 05/05/2020 Last data filed at 05/03/2020 0841 Gross per 24 hour  Intake  1767.43 ml  Output 4550 ml  Net -2782.57 ml   Filed Weights   05/01/20 0704  Weight: 117.9 kg   Examination: Constitutional: No distress Eyes: No icterus ENMT: mmm Neck: normal, supple Respiratory: CTA biL, no wheezing Cardiovascular: RRR no murmurs Abdomen: non distended, no tenderness. Bowel sounds positive.  Musculoskeletal: no clubbing / cyanosis.   Right foot Ace wrapped Skin: no rashes Neurologic: Nonfocal  Data Reviewed: I have independently reviewed following labs and imaging studies   CBC: Recent Labs  Lab 05/01/20 0739 05/02/20 0300 05/03/20 0544  WBC 7.6 5.6 6.6  NEUTROABS 4.6  --   --   HGB 10.9*  10.8* 11.3* 12.5*  HCT 35.2* 37.2* 39.0  MCV 83.4 82.5 79.8*  PLT 202 160 198   Basic Metabolic Panel: Recent Labs  Lab 05/01/20 0739 05/02/20 0300 05/03/20 0544  NA 133* 136 137  K 3.7 5.2* 3.7  CL 97* 100 101  CO2 26 27 25   GLUCOSE 105* 108* 102*  BUN 13 8 6   CREATININE 0.95 0.88 0.82  CALCIUM 8.4* 8.5* 8.9  MG 2.0  --   --    Liver Function Tests: Recent Labs  Lab 05/01/20 0739 05/03/20 0544  AST 37 29  ALT 27 24  ALKPHOS 72 62  BILITOT 0.3 0.6  PROT 8.1 7.6  ALBUMIN 3.1* 2.7*   Coagulation Profile: No results for input(s): INR, PROTIME in the last 168 hours. HbA1C: Recent Labs    05/03/20 0544  HGBA1C 5.9*   CBG: Recent Labs  Lab 05/03/20 0841  GLUCAP 104*    Recent Results (from the past 240 hour(s))  Culture, blood (routine x 2)     Status: None (Preliminary result)   Collection Time: 05/01/20  7:39 AM   Specimen: BLOOD  Result Value Ref Range Status   Specimen Description BLOOD SITE NOT SPECIFIED DRAWN BY RN  Final   Special Requests   Final    BOTTLES DRAWN AEROBIC AND ANAEROBIC Blood Culture results may not be optimal due to an inadequate volume of blood received in culture bottles   Culture   Final    NO GROWTH < 24 HOURS Performed at [facility], [address] [place], [address] [place]    Report Status PENDING  Incomplete  SARS Coronavirus 2 by RT PCR (hospital order, performed in [facility] hospital lab) Nasopharyngeal Nasopharyngeal Swab     Status: None   Collection Time: 05/01/20 11:48 AM   Specimen: Nasopharyngeal Swab  Result Value Ref Range Status   SARS Coronavirus 2 NEGATIVE NEGATIVE Final    Comment: (NOTE) SARS-CoV-2 target nucleic acids are NOT  DETECTED.  The SARS-CoV-2 RNA is generally detectable in upper and lower respiratory specimens during the acute phase of infection. The lowest concentration of SARS-CoV-2 viral copies this assay can detect is 250 copies / mL. A negative result does not preclude SARS-CoV-2 infection and should not be used as the sole basis for treatment or other patient management decisions.  A negative result may occur with improper specimen collection / handling, submission of specimen other than nasopharyngeal swab, presence of viral mutation(s) within the areas targeted by this assay, and inadequate number of viral copies (<250 copies / mL). A negative result must be combined with clinical observations, patient history, and epidemiological information.  Fact Sheet for Patients:   [facility]  Fact Sheet for Healthcare Providers: 05/03/20  This test is not yet approved or  cleared by the [employer] FDA and has been  authorized for detection and/or diagnosis of SARS-CoV-2 by FDA under an Emergency Use Authorization (EUA).  This EUA will remain in effect (meaning this test can be used) for the duration of the COVID-19 declaration under Section 564(b)(1) of the Act, 21 U.S.C. section 360bbb-3(b)(1), unless the authorization is terminated or revoked sooner.  Performed at [facility], [address], [place], [place] [address]   Surgical pcr screen     Status: Abnormal   Collection Time: 05/02/20  5:08 PM   Specimen: Nasal Mucosa; Nasal Swab  Result Value Ref Range Status   MRSA, PCR POSITIVE (A) NEGATIVE Final    Comment: RESULT CALLED TO, READ BACK BY AND VERIFIED WITH: SISON,C RN 05/02/2020 AT 2018 SKEEN,P    Staphylococcus aureus POSITIVE (A) NEGATIVE Final    Comment: (NOTE) The Xpert SA Assay (FDA approved for NASAL specimens in patients 18 years of age and older), is one component of a comprehensive surveillance program. It  is not intended to diagnose infection nor to guide or monitor treatment. Performed at [facility], [place] [address] [place], [place] [address]      Radiology Studies: No results found.  Marzetta Board, MD, PhD [facility]  Between 7 am - 7 pm I am available, please contact me via Amion or Securechat  Between 7 pm - 7 am I am not available, please contact night coverage MD/APP via Amion

## 2020-05-03 NOTE — Transfer of Care (Signed)
Immediate Anesthesia Transfer of Care Note  Patient: [name]  Procedure(s) Performed: AMPUTATION BELOW KNEE (Right Leg Lower)  Patient Location: PACU  Anesthesia Type:General  Level of Consciousness: awake, alert  and oriented  Airway & Oxygen Therapy: Patient Spontanous Breathing and Patient connected to nasal cannula oxygen  Post-op Assessment: Report given to RN, Post -op Vital signs reviewed and stable and Patient moving all extremities X 4  Post vital signs: Reviewed and stable  Last Vitals:  Vitals Value Taken Time  BP    Temp    Pulse    Resp    SpO2      Last Pain:  Vitals:   05/03/20 0409  TempSrc: Oral  PainSc:          Complications: No complications documented.

## 2020-05-03 NOTE — Anesthesia Postprocedure Evaluation (Signed)
Anesthesia Post Note  Patient: [name]  Procedure(s) Performed: AMPUTATION BELOW KNEE (Right Leg Lower)     Patient location during evaluation: PACU Anesthesia Type: General Level of consciousness: awake and alert Pain management: pain level controlled Vital Signs Assessment: post-procedure vital signs reviewed and stable Respiratory status: spontaneous breathing, nonlabored ventilation, respiratory function stable and patient connected to nasal cannula oxygen Cardiovascular status: blood pressure returned to baseline and stable Postop Assessment: no apparent nausea or vomiting Anesthetic complications: no   No complications documented.  Last Vitals:  Vitals:   05/03/20 0855 05/03/20 0919  BP: 109/74 120/74  Pulse: 73 67  Resp: 13 20  Temp: 36.8 C 36.8 C  SpO2: 100% 94%    Last Pain:  Vitals:   05/03/20 0919  TempSrc: Oral  PainSc:                  Kennieth Rad

## 2020-05-03 NOTE — Anesthesia Procedure Notes (Signed)
Procedure Name: Intubation Date/Time: 05/03/2020 7:47 AM Performed by: Carmela Rima, CRNA Pre-anesthesia Checklist: Timeout performed, Patient being monitored, Suction available, Patient identified and Emergency Drugs available Patient Re-evaluated:Patient Re-evaluated prior to induction Oxygen Delivery Method: Circle system utilized Preoxygenation: Pre-oxygenation with 100% oxygen Induction Type: IV induction Ventilation: Mask ventilation without difficulty LMA: LMA inserted LMA Size: 5.0 Number of attempts: 1 Placement Confirmation: ETT inserted through vocal cords under direct vision,  positive ETCO2 and breath sounds checked- equal and bilateral Tube secured with: Tape Dental Injury: Teeth and Oropharynx as per pre-operative assessment

## 2020-05-03 NOTE — Progress Notes (Signed)
Patient arrived back to room 6N19 from PACU via bed. Patient is alert and oriented times 4. VSS. NAD. Belongings and call bell within reach. Bed is in the lowest and locked position with bed rails up times 2. Wound vac in place and Prevena brought with patient. Patient updated as to plan of care. All questions and pain concerns addressed at this time.

## 2020-05-03 NOTE — Anesthesia Procedure Notes (Signed)
Anesthesia Regional Block: Adductor canal block   Pre-Anesthetic Checklist: ,, timeout performed, Correct Patient, Correct Site, Correct Laterality, Correct Procedure, Correct Position, site marked, Risks and benefits discussed,  Surgical consent,  Pre-op evaluation,  At surgeon's request and post-op pain management  Laterality: Right  Prep: chloraprep       Needles:  Injection technique: Single-shot  Needle Type: Echogenic Needle     Needle Length: 9cm  Needle Gauge: 21     Additional Needles:   Procedures:,,,, ultrasound used (permanent image in chart),,,,  Narrative:  Start time: 05/03/2020 7:15 AM End time: 05/03/2020 7:19 AM Injection made incrementally with aspirations every 5 mL.  Performed by: Personally  Anesthesiologist: Marcene Duos, MD

## 2020-05-04 ENCOUNTER — Encounter (HOSPITAL_COMMUNITY): Payer: Self-pay | Admitting: Orthopedic Surgery

## 2020-05-04 LAB — CREATININE, SERUM
Creatinine, Ser: 0.89 mg/dL (ref 0.61–1.24)
GFR calc Af Amer: >60 mL/min (ref >60)
GFR calc non Af Amer: >60 mL/min (ref >60)

## 2020-05-04 MED ORDER — SODIUM CHLORIDE 0.9% FLUSH: 9.0000 mL | INTRAVENOUS | Status: DC | PRN

## 2020-05-04 MED ORDER — HYDROMORPHONE 1 MG/ML IV SOLN
INTRAVENOUS | Status: DC
  Administered 2020-05-04 (×2): 30 mg via INTRAVENOUS
  Administered 2020-05-04: 18 mg via INTRAVENOUS
  Administered 2020-05-04: 9 mg via INTRAVENOUS
  Administered 2020-05-04: 30 mg via INTRAVENOUS
  Administered 2020-05-04: 10 mg via INTRAVENOUS
  Administered 2020-05-04: 6 mg via INTRAVENOUS
  Administered 2020-05-05: 12 mg via INTRAVENOUS
  Filled 2020-05-04 (×3): qty 30

## 2020-05-04 MED ORDER — ONDANSETRON HCL 4 MG/2ML IJ SOLN: 4.0000 mg | Freq: Four times a day (QID) | INTRAMUSCULAR | Status: DC | PRN

## 2020-05-04 MED ORDER — HYDROMORPHONE 1 MG/ML IV SOLN
INTRAVENOUS | Status: DC
  Administered 2020-05-04: 30 mg via INTRAVENOUS
  Filled 2020-05-04: qty 30

## 2020-05-04 MED ORDER — CHLORHEXIDINE GLUCONATE CLOTH 2 % EX PADS
6.0000 | MEDICATED_PAD | Freq: Every day | CUTANEOUS | Status: DC
  Administered 2020-05-05: 6 via TOPICAL

## 2020-05-04 MED ORDER — MUPIROCIN 2 % EX OINT
1.0000 "application " | TOPICAL_OINTMENT | Freq: Two times a day (BID) | CUTANEOUS | Status: DC
  Administered 2020-05-04 – 2020-05-05 (×3): 1 via NASAL
  Filled 2020-05-04 (×2): qty 22

## 2020-05-04 MED ORDER — NALOXONE HCL 0.4 MG/ML IJ SOLN: 0.4000 mg | INTRAMUSCULAR | Status: DC | PRN

## 2020-05-04 NOTE — Progress Notes (Signed)
Patient complains of excruciating and sharp pain to his left stump. Patient states to nurse " all the pain meds that you have given me did not do anything". Patient seen crying and moaning in bed. MD paged .

## 2020-05-04 NOTE — Social Work (Signed)
CSW acknowledging consult for SNF placement. Will follow for therapy recommendations needed to best determine disposition/for insurance authorization.   Gottfried Standish, MSW, LCSW  Clinical Social Work    

## 2020-05-04 NOTE — Progress Notes (Signed)
Orthopedic Tech Progress Note Patient Details:  [name] 08/24/1971 [id number] Called in order to [facility] for a VIVE PROTOCOL BK Patient ID: ETHON WYMER, male   DOB: Apr 15, 1971, 49 y.o.   MRN: [id number]   Donald Pore 05/04/2020, 8:15 AM

## 2020-05-04 NOTE — Progress Notes (Signed)
Patient ID: [name], male   DOB: [birth year], 49 y.o.   MRN: [id number] Patient is a 49 year old gentleman who is postoperative day 1 right transtibial amputation.  The wound VAC is functioning well there is no drainage.  Patient complains of increased pain after his regional block is worn off.  Anticipate patient will need to be discharged to skilled nursing.

## 2020-05-04 NOTE — Plan of Care (Signed)
  Problem: Education: Goal: Knowledge of General Education information will improve Description: Including pain rating scale, medication(s)/side effects and non-pharmacologic comfort measures Outcome: Progressing   Problem: Clinical Measurements: Goal: Will remain free from infection Outcome: Progressing   Problem: Pain Managment: Goal: General experience of comfort will improve Outcome: Not Progressing   Problem: Safety: Goal: Ability to remain free from injury will improve Outcome: Progressing

## 2020-05-04 NOTE — Progress Notes (Signed)
PROGRESS NOTE  [name] RJJ:[id number] DOB: 03-14-1971 DOA: 05/01/2020 PCP: Patient, No Pcp Per   LOS: 3 days   Brief Narrative / Interim history: 49 year old male with history of obesity, tobacco use, chronic back pain who came into the ED due to worsening swelling, pain and drainage out of his right foot.  Patient has been having problems with the right foot on and off for the past couple years, at one point he was following with podiatry, however in the last 2 weeks he appreciates that he developed a new drainage and swelling and decided to come to the hospital.  Patient was placed on IV antibiotics, CT scan of the foot showed extensive osteomyelitis/tissue infection, concern for septic arthritis.  Orthopedic surgery was consulted.  Subjective / 24h Interval events: Overnight patient complained of excruciating pain to the surgical site, had to be placed on a Dilaudid PCA pump.  He denies any shortness of breath, denies any chest pain this morning.  Still complains of pain but overall better  Assessment & Plan: Principal Problem Right foot osteomyelitis-patient was started on antibiotics, continue, appreciate orthopedic surgery consultation.  Dr. Lajoyce Corners with orthopedic surgery has been consulted and patient is status post BKA on 6/27.  Postoperative course complicated by excruciating pain requiring PCA pump with Dilaudid on 6/27-6/28 overnight.  Continue PCA pump at least over the next 24 hours, will try to discontinue tomorrow and switch to IV as needed with oral breakthroughs  Active Problems Obesity-BMI 35.2, he will need to lose weight which will also help when he will be fitted with a right foot prosthesis  Chronic back pain-continue as needed analgesics  Nicotine abuse-he seems determined to quit, continue nicotine patch  Anemia, iron deficiency-start iron supplementation  Hyperglycemia-no history of diabetes, A1c suggests prediabetes  Elevated blood pressure-likely due to  pain, monitor, if persists after pain is improved will need antihypertensives  Scheduled Meds: . docusate sodium  100 mg Oral BID  . HYDROmorphone   Intravenous Q4H  . nicotine  14 mg Transdermal Daily   Continuous Infusions: . sodium chloride 10 mL/hr at 05/03/20 0934  . methocarbamol (ROBAXIN) IV 500 mg (05/04/20 0218)   PRN Meds:.[DISCONTINUED] acetaminophen **OR** acetaminophen, acetaminophen, bisacodyl, magnesium citrate, methocarbamol **OR** methocarbamol (ROBAXIN) IV, metoCLOPramide **OR** metoCLOPramide (REGLAN) injection, naloxone **AND** sodium chloride flush, ondansetron **OR** ondansetron (ZOFRAN) IV, ondansetron (ZOFRAN) IV, polyethylene glycol  Diet Orders (From admission, onward)    Start     Ordered   05/03/20 0917  Diet Carb Modified Fluid consistency: Thin; Room service appropriate? Yes  Diet effective now       Question Answer Comment  Calorie Level Medium 1600-2000   Fluid consistency: Thin   Room service appropriate? Yes      05/03/20 0916          DVT prophylaxis: SCDs Start: 05/03/20 0917SCDs    Code Status: Full Code  Family Communication: no family at bedside   Status is: Inpatient  Remains inpatient appropriate because:Ongoing diagnostic testing needed not appropriate for outpatient work up and Inpatient level of care appropriate due to severity of illness  Dispo:  Patient From: Home  Planned Disposition: Home versus SNF  Expected discharge date: 05/06/20  Medically stable for discharge: No  Consultants:  Orthopedic surgery   Procedures:  BKA 6/27  Microbiology  None   Antimicrobials: Vancomycin 6/25 >> 6/28 Zosyn 6/25 >> 6/28  Objective: Vitals:   05/04/20 0438 05/04/20 0456 05/04/20 0611 05/04/20 0755  BP: (!) 151/92 Marland Kitchen)  149/101 135/79   Pulse: (!) 55 (!) 55 (!) 58   Resp: 17 20 16  (!) 21  Temp:  98.7 F (37.1 C) 98.6 F (37 C)   TempSrc:  Oral Oral   SpO2: 100% 100% 100% 99%  Weight:      Height:        Intake/Output  Summary (Last 24 hours) at 05/04/2020 0932 Last data filed at 05/04/2020 05/06/2020 Gross per 24 hour  Intake 1815.47 ml  Output 840 ml  Net 975.47 ml   Filed Weights   05/01/20 0704  Weight: 117.9 kg   Examination: Constitutional: In pain, a little bit groggy Eyes: No scleral icterus ENMT: Moist mucous membranes Neck: normal, supple Respiratory: Clear to auscultation bilaterally, no wheezing Cardiovascular: Regular rate and rhythm, no murmurs Abdomen: Nondistended, bowel sounds positive Musculoskeletal: no clubbing / cyanosis.  Right foot with a wound VAC in place Skin: No rashes Neurologic: No focal deficits  Data Reviewed: I have independently reviewed following labs and imaging studies   CBC: Recent Labs  Lab 05/01/20 0739 05/02/20 0300 05/03/20 0544  WBC 7.6 5.6 6.6  NEUTROABS 4.6  --   --   HGB 10.9*  10.8* 11.3* 12.5*  HCT 35.2* 37.2* 39.0  MCV 83.4 82.5 79.8*  PLT 202 160 198   Basic Metabolic Panel: Recent Labs  Lab 05/01/20 0739 05/02/20 0300 05/03/20 0544  NA 133* 136 137  K 3.7 5.2* 3.7  CL 97* 100 101  CO2 26 27 25   GLUCOSE 105* 108* 102*  BUN 13 8 6   CREATININE 0.95 0.88 0.82  CALCIUM 8.4* 8.5* 8.9  MG 2.0  --   --    Liver Function Tests: Recent Labs  Lab 05/01/20 0739 05/03/20 0544  AST 37 29  ALT 27 24  ALKPHOS 72 62  BILITOT 0.3 0.6  PROT 8.1 7.6  ALBUMIN 3.1* 2.7*   Coagulation Profile: No results for input(s): INR, PROTIME in the last 168 hours. HbA1C: Recent Labs    05/03/20 0544  HGBA1C 5.9*   CBG: Recent Labs  Lab 05/03/20 0841  GLUCAP 104*    Recent Results (from the past 240 hour(s))  Culture, blood (routine x 2)     Status: None (Preliminary result)   Collection Time: 05/01/20  7:39 AM   Specimen: BLOOD  Result Value Ref Range Status   Specimen Description BLOOD SITE NOT SPECIFIED DRAWN BY RN  Final   Special Requests   Final    BOTTLES DRAWN AEROBIC AND ANAEROBIC Blood Culture results may not be optimal due to  an inadequate volume of blood received in culture bottles   Culture   Final    NO GROWTH < 24 HOURS Performed at [facility], [address] [place], [facility] [address]    Report Status PENDING  Incomplete  SARS Coronavirus 2 by RT PCR (hospital order, performed in [facility] hospital lab) Nasopharyngeal Nasopharyngeal Swab     Status: None   Collection Time: 05/01/20 11:48 AM   Specimen: Nasopharyngeal Swab  Result Value Ref Range Status   SARS Coronavirus 2 NEGATIVE NEGATIVE Final    Comment: (NOTE) SARS-CoV-2 target nucleic acids are NOT DETECTED.  The SARS-CoV-2 RNA is generally detectable in upper and lower respiratory specimens during the acute phase of infection. The lowest concentration of SARS-CoV-2 viral copies this assay can detect is 250 copies / mL. A negative result does not preclude SARS-CoV-2 infection and should not be used as the sole basis for treatment  or other patient management decisions.  A negative result may occur with improper specimen collection / handling, submission of specimen other than nasopharyngeal swab, presence of viral mutation(s) within the areas targeted by this assay, and inadequate number of viral copies (<250 copies / mL). A negative result must be combined with clinical observations, patient history, and epidemiological information.  Fact Sheet for Patients:   BoilerBrush.com.cy  Fact Sheet for Healthcare Providers: https://pope.com/  This test is not yet approved or  cleared by the [place] FDA and has been authorized for detection and/or diagnosis of SARS-CoV-2 by FDA under an Emergency Use Authorization (EUA).  This EUA will remain in effect (meaning this test can be used) for the duration of the COVID-19 declaration under Section 564(b)(1) of the Act, 21 U.S.C. section 360bbb-3(b)(1), unless the authorization is terminated or revoked sooner.  Performed at [facility],  [address] [place], [place] [address]   Surgical pcr screen     Status: Abnormal   Collection Time: 05/02/20  5:08 PM   Specimen: Nasal Mucosa; Nasal Swab  Result Value Ref Range Status   MRSA, PCR POSITIVE (A) NEGATIVE Final    Comment: RESULT CALLED TO, READ BACK BY AND VERIFIED WITH: SISON,C RN 05/02/2020 AT 2018 SKEEN,P    Staphylococcus aureus POSITIVE (A) NEGATIVE Final    Comment: (NOTE) The Xpert SA Assay (FDA approved for NASAL specimens in patients 3 years of age and older), is one component of a comprehensive surveillance program. It is not intended to diagnose infection nor to guide or monitor treatment. Performed at [facility], [address], [place], [place] [address]      Radiology Studies: No results found.  Pamella Pert, MD, PhD [facility]  Between 7 am - 7 pm I am available, please contact me via Amion or Securechat  Between 7 pm - 7 am I am not available, please contact night coverage MD/APP via Amion

## 2020-05-04 NOTE — Progress Notes (Signed)
Patient heard from the nurses station  screaming and crying. Upon entering his room he was seen crying . He stated to this nurse " it hurts so bad, please, please , this pain is killing me. Its bad,  help me, help me. I need something please ." MD paged

## 2020-05-04 NOTE — Evaluation (Signed)
Physical Therapy Evaluation Patient Details Name: [name] MRN: [id number] DOB: Jan 15, 1971 Today's Date: 05/04/2020   History of Present Illness  [name] is a 49 y.o. male who presents with chronic ulceration swelling and purulent drainage right foot.  Patient states that he has had symptoms for approximately 6 months.  He states he has had on and off purulent drainage at this time he complains of pain swelling and persistent purulent drainage right foot. S/P right BKA.  Clinical Impression  Patient received in bed, sleeping on his side. Rouses to my voice. Patient reports 100/10 pain in right leg. Agitated about pain level that every time he gets comfortable someone comes in and gets his pain level up again. He demonstrates bed mobility with mod independence. He declines attempting to stand at this time due to pain. I believe once pain is better managed, he will progress well with mobility.  He will continue to benefit from skilled PT while here to improve functional mobility, safety and strengthening.      Follow Up Recommendations Home health PT    Equipment Recommendations  Rolling walker with 5" wheels    Recommendations for Other Services       Precautions / Restrictions Precautions Precautions: Fall Restrictions Weight Bearing Restrictions: Yes RLE Weight Bearing: Non weight bearing      Mobility  Bed Mobility Overal bed mobility: Modified Independent             General bed mobility comments: patient performed supine><sit with bed rail and increased effort due to pain  Transfers                 General transfer comment: patient would not attempt transfers  Ambulation/Gait             General Gait Details: he would not attempt walking due to pain level  Stairs            Wheelchair Mobility    Modified Rankin (Stroke Patients Only)       Balance                                              Pertinent Vitals/Pain Pain Assessment: 0-10 Pain Score: 10-Worst pain ever Pain Location: R leg. Reports pain is 100/10 Pain Descriptors / Indicators: Sore;Grimacing Pain Intervention(s): Limited activity within patient's tolerance;Monitored during session;PCA encouraged    Home Living Family/patient expects to be discharged to:: Private residence Living Arrangements: Children Available Help at Discharge: Family;Available PRN/intermittently Type of Home: House Home Access: Level entry     Home Layout: One level Home Equipment: None      Prior Function Level of Independence: Independent               Hand Dominance        Extremity/Trunk Assessment   Upper Extremity Assessment Upper Extremity Assessment: Overall WFL for tasks assessed    Lower Extremity Assessment Lower Extremity Assessment: Overall WFL for tasks assessed       Communication   Communication: No difficulties  Cognition Arousal/Alertness: Awake/alert Behavior During Therapy: Agitated Overall Cognitive Status: Within Functional Limits for tasks assessed                                 General Comments: Patient cursing throughout stating he's  hurting so bad, dont touch his leg. Everytime he gets comfortable someone comes in.      General Comments General comments (skin integrity, edema, etc.): sitting balance requires supervision only, standing not assessed    Exercises     Assessment/Plan    PT Assessment Patient needs continued PT services  PT Problem List Decreased mobility;Decreased activity tolerance;Pain       PT Treatment Interventions DME instruction;Therapeutic activities;Gait training;Therapeutic exercise;Patient/family education;Balance training;Functional mobility training;Neuromuscular re-education    PT Goals (Current goals can be found in the Care Plan section)  Acute Rehab PT Goals Patient Stated Goal: decrease pain PT Goal Formulation: With  patient Time For Goal Achievement: 05/11/20 Potential to Achieve Goals: Good    Frequency Min 3X/week   Barriers to discharge   has been living with son, plans to return home at discharge    Co-evaluation               AM-PAC PT "6 Clicks" Mobility  Outcome Measure Help needed turning from your back to your side while in a flat bed without using bedrails?: None Help needed moving from lying on your back to sitting on the side of a flat bed without using bedrails?: A Little Help needed moving to and from a bed to a chair (including a wheelchair)?: A Little Help needed standing up from a chair using your arms (e.g., wheelchair or bedside chair)?: A Little Help needed to walk in hospital room?: A Lot Help needed climbing 3-5 steps with a railing? : A Lot 6 Click Score: 17    End of Session   Activity Tolerance: Patient limited by pain Patient left: in bed;with bed alarm set;with call bell/phone within reach Nurse Communication: Mobility status PT Visit Diagnosis: Other abnormalities of gait and mobility (R26.89);Pain Pain - Right/Left: Right Pain - part of body: Leg    Time: 1410-1418 PT Time Calculation (min) (ACUTE ONLY): 8 min   Charges:   PT Evaluation $PT Eval Moderate Complexity: 1 Mod          Bradi Arbuthnot, PT, GCS 05/04/20,3:01 PM

## 2020-05-04 NOTE — Progress Notes (Signed)
This RN wasted 7 mg of Hydromorphine (Dilaudid) into the stericylce bin. Witnessed by Harriett Sine, RN.

## 2020-05-04 NOTE — TOC Initial Note (Signed)
Transition of Care [facility]) - Initial/Assessment Note    Patient Details  Name: [name] MRN: [id number] Date of Birth: 04/20/1971  [facility] of [facility]) CM/SW Contact:    Alexander Mt, LCSW Phone Number: 05/04/2020, 12:13 PM  Clinical Narrative:                 CSW met with pt at bedside. Pt extremely lethargic, on PCA for pain mgmt. Pt able to share that he lives with his youngest son in [place] and that he didn't use any DME prior to admission. CSW did seek permission to call son, pt declined. CSW inquired about whether or not pt may think he needs rehab at discharge. Pt states he wants to go home, due to lethargy etc CSW/TOC team will return to discuss recommendations with pt once therapy has been able to work with him.   Expected Discharge Plan: Skilled Nursing Facility Barriers to Discharge: Continued Medical Work up, Ship broker   Patient Goals and CMS Choice Patient states their goals for this hospitalization and ongoing recovery are:: to go home   Choice offered to / list presented to : Patient  Expected Discharge Plan and Services Expected Discharge Plan: [place] In-house Referral: Clinical Social Work Discharge Planning Services: CM Consult, DC out of service area Post Acute Care Choice: [place] arrangements for the past 2 months: Single Family Home                                      Prior Living Arrangements/Services Living arrangements for the past 2 months: Single Family Home Lives with:: Adult Children Patient language and need for interpreter reviewed:: Yes (no needs) Do you feel safe going back to the place where you live?: Yes      Need for Family Participation in Patient Care: Yes (Comment) (assistance at discharge) Care giver support system in place?: Yes (comment) (unclear at this time- pt with adult sons)   Criminal Activity/Legal Involvement Pertinent to Current  Situation/Hospitalization: No - Comment as needed  Permission Sought/Granted Permission sought to share information with : Family Supports (requested to call pt son- pt declined) Permission granted to share information with : No    Emotional Assessment Appearance:: Appears stated age Attitude/Demeanor/Rapport: Sedated   Orientation: : Oriented to Situation, Oriented to  Time, Oriented to Place, Oriented to Self Alcohol / Substance Use: Tobacco Use Psych Involvement:  (n/a)  Admission diagnosis:  Foot osteomyelitis, right ([place]) [M86.9] Open displaced fracture of second metatarsal bone of right foot, initial encounter [S92.321B] Osteomyelitis of right foot, unspecified type [facility]) [M86.9] Patient Active Problem List   Diagnosis Date Noted   Foot osteomyelitis, right ([place]) 05/01/2020   PCP:  Patient, No Pcp Per Pharmacy:   [facility] [place], [place] [address] Phone: [phone] Fax: [phone]  Readmission Risk Interventions No flowsheet data found.

## 2020-05-05 LAB — COMPREHENSIVE METABOLIC PANEL
ALT: 21 U/L (ref 0–44)
AST: 30 U/L (ref 15–41)
Albumin: 2.6 g/dL — ABNORMAL LOW (ref 3.5–5.0)
Alkaline Phosphatase: 51 U/L (ref 38–126)
Anion gap: 8 (ref 5–15)
BUN: 6 mg/dL (ref 6–20)
CO2: 28 mmol/L (ref 22–32)
Calcium: 8.8 mg/dL — ABNORMAL LOW (ref 8.9–10.3)
Chloride: 101 mmol/L (ref 98–111)
Creatinine, Ser: 0.84 mg/dL (ref 0.61–1.24)
GFR calc Af Amer: >60 mL/min (ref >60)
GFR calc non Af Amer: >60 mL/min (ref >60)
Glucose, Bld: 126 mg/dL — ABNORMAL HIGH (ref 70–99)
Potassium: 4.2 mmol/L (ref 3.5–5.1)
Sodium: 137 mmol/L (ref 135–145)
Total Bilirubin: 0.4 mg/dL (ref 0.3–1.2)
Total Protein: 7.2 g/dL (ref 6.5–8.1)

## 2020-05-05 LAB — CBC
HCT: 36.6 % — ABNORMAL LOW (ref 39.0–52.0)
Hemoglobin: 11.2 g/dL — ABNORMAL LOW (ref 13.0–17.0)
MCH: 24.9 pg — ABNORMAL LOW (ref 26.0–34.0)
MCHC: 30.6 g/dL (ref 30.0–36.0)
MCV: 81.5 fL (ref 80.0–100.0)
Platelets: 186 10*3/uL (ref 150–400)
RBC: 4.49 MIL/uL (ref 4.22–5.81)
RDW: 14.8 % (ref 11.5–15.5)
WBC: 8.3 10*3/uL (ref 4.0–10.5)
nRBC: 0 % (ref 0.0–0.2)

## 2020-05-05 LAB — MAGNESIUM: Magnesium: 2 mg/dL (ref 1.7–2.4)

## 2020-05-05 MED ORDER — HYDROMORPHONE HCL 1 MG/ML IJ SOLN
1.0000 mg | INTRAMUSCULAR | Status: DC | PRN
  Administered 2020-05-05 – 2020-05-06 (×7): 1 mg via INTRAVENOUS
  Filled 2020-05-05 (×7): qty 1

## 2020-05-05 MED ORDER — DIPHENHYDRAMINE HCL 50 MG/ML IJ SOLN
25.0000 mg | Freq: Once | INTRAMUSCULAR | Status: AC
  Administered 2020-05-05: 25 mg via INTRAVENOUS
  Filled 2020-05-05: qty 1

## 2020-05-05 MED ORDER — FERROUS GLUCONATE 324 (38 FE) MG PO TABS
324.0000 mg | ORAL_TABLET | Freq: Every day | ORAL | Status: DC
  Administered 2020-05-06: 324 mg via ORAL
  Filled 2020-05-05 (×2): qty 1

## 2020-05-05 MED ORDER — OXYCODONE HCL 5 MG PO TABS
10.0000 mg | ORAL_TABLET | ORAL | Status: DC | PRN
  Administered 2020-05-05 – 2020-05-06 (×6): 10 mg via ORAL
  Filled 2020-05-05 (×6): qty 2

## 2020-05-05 MED ORDER — HYDROXYZINE HCL 10 MG PO TABS
10.0000 mg | ORAL_TABLET | Freq: Three times a day (TID) | ORAL | Status: DC | PRN
  Administered 2020-05-05: 10 mg via ORAL
  Filled 2020-05-05 (×3): qty 1

## 2020-05-05 NOTE — Progress Notes (Signed)
[name], [name]   LOS: 4 days   Brief Narrative / Interim history: 49 year old male with history of obesity, tobacco use, chronic back pain who came into the ED due to worsening swelling, pain and drainage out of his right foot.  Wagner has been having problems with the right foot on and off for the past couple years, at one point he was following with podiatry, however in the last 2 weeks he appreciates that he developed a new drainage and swelling and decided to come to the hospital.  Wagner was placed on IV antibiotics, CT scan of the foot showed extensive osteomyelitis/tissue infection, concern for septic arthritis.  Orthopedic surgery was consulted and Wagner is status post BKA on 6/27.  Postop course complicated by severe pain requiring PCA Dilaudid pump.  Subjective / 24h Interval events: His pain is better today, overall feels more comfortable.  James chest pain, James shortness of breath.  Assessment & Plan: Principal Problem Right foot osteomyelitis-Wagner was started on antibiotics, continue, appreciate orthopedic surgery consultation.  Dr. Lajoyce Corners with orthopedic surgery has been consulted and Wagner is status post BKA on 6/27.  Postoperative course complicated by excruciating pain requiring PCA pump with Dilaudid on 6/27-6/28 overnight. Reevaluated Wagner this morning and he appears more comfortable.  Will attempt to discontinue PCA pump and placed on IV Dilaudid alternating with p.o. oxycodone.  Discussed with the Wagner as well as bedside RN.  Active Problems Obesity-BMI 35.2, he will need to lose weight which will also help when he will be fitted with a right foot prosthesis  Chronic back pain-not an issue right now  Nicotine abuse-he seems determined to quit, continue nicotine patch  Anemia, iron deficiency-start iron  Hyperglycemia-James history of diabetes, A1c suggests  prediabetes  Elevated blood pressure-likely due to pain, now improved this his pain is better controlled.  Does not look like he needs antihypertensives currently  Scheduled Meds: . Chlorhexidine Gluconate Cloth  6 each Topical Q0600  . docusate sodium  100 mg Oral BID  . [START ON 05/06/2020] ferrous gluconate  324 mg Oral Q breakfast  . mupirocin ointment  1 application Nasal BID  . nicotine  14 mg Transdermal Daily   Continuous Infusions: . sodium chloride 10 mL/hr at 05/03/20 0934  . methocarbamol (ROBAXIN) IV 500 mg (05/04/20 0218)   PRN Meds:.[DISCONTINUED] acetaminophen **OR** acetaminophen, acetaminophen, bisacodyl, HYDROmorphone (DILAUDID) injection, hydrOXYzine, magnesium citrate, methocarbamol **OR** methocarbamol (ROBAXIN) IV, metoCLOPramide **OR** metoCLOPramide (REGLAN) injection, ondansetron **OR** ondansetron (ZOFRAN) IV, oxyCODONE, polyethylene glycol  Diet Orders (From admission, onward)    Start     Ordered   05/03/20 0917  Diet Carb Modified Fluid consistency: Thin; Room service appropriate? Yes  Diet effective now       Question Answer Comment  Calorie Level Medium 1600-2000   Fluid consistency: Thin   Room service appropriate? Yes      05/03/20 0916          DVT prophylaxis: SCDs Start: 05/03/20 0917SCDs    Code Status: Full Code  Family Communication: James family at bedside   Status is: Inpatient  Remains inpatient appropriate because: Weaning off intravenous pain medications  Dispo:  Wagner From: Home  Planned Disposition: Home   Expected discharge date: 05/06/20 potentially if he is comfortable off of IV pain medications  Medically stable for discharge: James  Consultants:  Orthopedic surgery   Procedures:  BKA 6/27  Microbiology  None  Antimicrobials: Vancomycin 6/25 >> 6/28 Zosyn 6/25 >> 6/28  Objective: Vitals:   05/05/20 0341 05/05/20 0431 05/05/20 0802 05/05/20 1222  BP:  110/65 108/61 (!) 112/59  Pulse:  66 78 82  Resp: 17 13  15 16   Temp:  98.3 F (36.8 C) 99.2 F (37.3 C) 98.8 F (37.1 C)  TempSrc:  Oral Oral Oral  SpO2: 96% 98% 99% 98%  Weight:      Height:        Intake/Output Summary (Last 24 hours) at 05/05/2020 1257 Last data filed at 05/05/2020 1000 Gross Wagner 24 hour  Intake 600 ml  Output 2250 ml  Net -1650 ml   Filed Weights   05/01/20 0704  Weight: 117.9 kg   Examination: Constitutional: More comfortable, eating breakfast Eyes: James icterus seen ENMT: Moist mucous membranes Neck: normal, supple Respiratory: Clear bilaterally without wheezing or crackles Cardiovascular: Regular rate and rhythm, James murmurs, James edema Abdomen: Soft, nontender, nondistended, bowel sounds positive Musculoskeletal: Right BKA site with wound VAC in place Skin: James rashes seen Neurologic: Equal strength, James focal deficits  Data Reviewed: I have independently reviewed following labs and imaging studies   CBC: Recent Labs  Lab 05/01/20 0739 05/02/20 0300 05/03/20 0544 05/05/20 0242  WBC 7.6 5.6 6.6 8.3  NEUTROABS 4.6  --   --   --   HGB 10.9*  10.8* 11.3* 12.5* 11.2*  HCT 35.2* 37.2* 39.0 36.6*  MCV 83.4 82.5 79.8* 81.5  PLT 202 160 198 186   Basic Metabolic Panel: Recent Labs  Lab 05/01/20 0739 05/02/20 0300 05/03/20 0544 05/04/20 1121 05/05/20 0242  NA 133* 136 137  --  137  K 3.7 5.2* 3.7  --  4.2  CL 97* 100 101  --  101  CO2 26 27 25   --  28  GLUCOSE 105* 108* 102*  --  126*  BUN 13 8 6   --  6  CREATININE 0.95 0.88 0.82 0.89 0.84  CALCIUM 8.4* 8.5* 8.9  --  8.8*  MG 2.0  --   --   --  2.0   Liver Function Tests: Recent Labs  Lab 05/01/20 0739 05/03/20 0544 05/05/20 0242  AST 37 29 30  ALT 27 24 21   ALKPHOS 72 62 51  BILITOT 0.3 0.6 0.4  PROT 8.1 7.6 7.2  ALBUMIN 3.1* 2.7* 2.6*   Coagulation Profile: James results for input(s): INR, PROTIME in the last 168 hours. HbA1C: Recent Labs    05/03/20 0544  HGBA1C 5.9*   CBG: Recent Labs  Lab 05/03/20 0841  GLUCAP 104*     Recent Results (from the past 240 hour(s))  Culture, blood (routine x 2)     Status: None (Preliminary result)   Collection Time: 05/01/20  7:39 AM   Specimen: BLOOD  Result Value Ref Range Status   Specimen Description BLOOD SITE NOT SPECIFIED DRAWN BY RN  Final   Special Requests   Final    BOTTLES DRAWN AEROBIC AND ANAEROBIC Blood Culture results may not be optimal due to an inadequate volume of blood received in culture bottles   Culture   Final    James GROWTH 4 DAYS Performed at [facility], [address] [place], 05/03/20 [facility]    Report Status PENDING  Incomplete  SARS Coronavirus 2 by RT PCR (hospital order, performed in [facility] hospital lab) Nasopharyngeal Nasopharyngeal Swab     Status: None   Collection Time: 05/01/20 11:48 AM   Specimen: Nasopharyngeal Swab  Result Value Ref Range Status   SARS Coronavirus 2 NEGATIVE NEGATIVE Final    Comment: (NOTE) SARS-CoV-2 target nucleic acids are NOT DETECTED.  The SARS-CoV-2 RNA is generally detectable in upper and lower respiratory specimens during the acute phase of infection. The lowest concentration of SARS-CoV-2 viral copies this assay can detect is 250 copies / mL. A negative result does not preclude SARS-CoV-2 infection and should not be used as the sole basis for treatment or other Wagner management decisions.  A negative result may occur with improper specimen collection / handling, submission of specimen other than nasopharyngeal swab, presence of viral mutation(s) within the areas targeted by this assay, and inadequate number of viral copies (<250 copies / mL). A negative result must be combined with clinical observations, Wagner history, and epidemiological information.  Fact Sheet for Patients:   BoilerBrush.com.cy  Fact Sheet for Healthcare Providers: https://pope.com/  This test is not yet approved or  cleared by the [place] FDA and has been  authorized for detection and/or diagnosis of SARS-CoV-2 by FDA under an Emergency Use Authorization (EUA).  This EUA will remain in effect (meaning this test can be used) for the duration of the COVID-19 declaration under Section 564(b)(1) of the Act, 21 U.S.C. section 360bbb-3(b)(1), unless the authorization is terminated or revoked sooner.  Performed at [facility], [address], [place], [place] [address]   Surgical pcr screen     Status: Abnormal   Collection Time: 05/02/20  5:08 PM   Specimen: Nasal Mucosa; Nasal Swab  Result Value Ref Range Status   MRSA, PCR POSITIVE (A) NEGATIVE Final    Comment: RESULT CALLED TO, READ BACK BY AND VERIFIED WITH: SISON,C RN 05/02/2020 AT 2018 SKEEN,P    Staphylococcus aureus POSITIVE (A) NEGATIVE Final    Comment: (NOTE) The Xpert SA Assay (FDA approved for NASAL specimens in patients 70 years of age and older), is one component of a comprehensive surveillance program. It is not intended to diagnose infection nor to guide or monitor treatment. Performed at [facility], [address] [place], [place] [address]      Radiology Studies: James results found.  Pamella Pert, MD, PhD [facility]  Between 7 am - 7 pm I am available, please contact me via Amion or Securechat  Between 7 pm - 7 am I am not available, please contact night coverage MD/APP via Amion

## 2020-05-05 NOTE — Progress Notes (Signed)
Pt c/o generalized body itching. This RN paged the MD on call. New orders given at this time, see MAR. Will continue to monitor pt.

## 2020-05-05 NOTE — Plan of Care (Signed)
  Problem: Coping: Goal: Level of anxiety will decrease Outcome: Progressing   Problem: Pain Managment: Goal: General experience of comfort will improve Outcome: Progressing   Problem: Safety: Goal: Ability to remain free from injury will improve Outcome: Progressing   Problem: Skin Integrity: Goal: Risk for impaired skin integrity will decrease Outcome: Progressing   

## 2020-05-05 NOTE — Progress Notes (Signed)
Physical Therapy Treatment Patient Details Name: [name] MRN: [id number] DOB: 1970/11/30 Today's Date: 05/05/2020    History of Present Illness [name] is a 49 y.o. male who presents with chronic ulceration swelling and purulent drainage right foot.  Patient states that he has had symptoms for approximately 6 months.  He states he has had on and off purulent drainage at this time he complains of pain swelling and persistent purulent drainage right foot. S/P right BKA.    PT Comments    Pt supine in bed on arrival.  Pt required max encouragement to participate in PT session.  Pt supine in bed on arrival.  Educated on the importance of resting in extension on R LE.  Donned limb guard to assist in maintaining extension.  Continue to recommend HHPT at d/c.    Follow Up Recommendations  Home health PT     Equipment Recommendations  Rolling walker with 5" wheels    Recommendations for Other Services       Precautions / Restrictions Precautions Precautions: Fall Restrictions Weight Bearing Restrictions: Yes RLE Weight Bearing: Non weight bearing    Mobility  Bed Mobility Overal bed mobility: Modified Independent             General bed mobility comments: patient performed supine<>sit with bed rail and increased effort due to pain  Transfers Overall transfer level: Needs assistance Equipment used: None Transfers: Sit to/from Stand;Stand Pivot Transfers Sit to Stand: Min guard Stand pivot transfers: Min guard       General transfer comment: Partial stand from bed to recliner and recliner back to bed. He required min guard for safety.  refused to use RW, reports, " I don't need that."  Ambulation/Gait                 Stairs             Wheelchair Mobility    Modified Rankin (Stroke Patients Only)       Balance Overall balance assessment: Needs assistance   Sitting balance-Leahy Scale: Good       Standing balance-Leahy  Scale: Poor                              Cognition Arousal/Alertness: Awake/alert Behavior During Therapy: Impulsive Overall Cognitive Status: Within Functional Limits for tasks assessed                                 General Comments: Pt more pleasant this session.      Exercises Amputee Exercises Hip Extension: AROM;Right;Sidelying;10 reps Hip ABduction/ADduction: AROM;Right;10 reps;Sidelying    General Comments        Pertinent Vitals/Pain Pain Assessment: 0-10 Pain Score: 8  Pain Location: R LEG Pain Descriptors / Indicators: Sore;Grimacing Pain Intervention(s): Repositioned;Monitored during session;RN gave pain meds during session    Home Living                      Prior Function            PT Goals (current goals can now be found in the care plan section) Acute Rehab PT Goals Patient Stated Goal: decrease pain Potential to Achieve Goals: Good Progress towards PT goals: Progressing toward goals    Frequency    Min 3X/week      PT Plan Current plan remains appropriate  Co-evaluation              AM-PAC PT "6 Clicks" Mobility   Outcome Measure  Help needed turning from your back to your side while in a flat bed without using bedrails?: None Help needed moving from lying on your back to sitting on the side of a flat bed without using bedrails?: A Little Help needed moving to and from a bed to a chair (including a wheelchair)?: A Little Help needed standing up from a chair using your arms (e.g., wheelchair or bedside chair)?: A Little Help needed to walk in hospital room?: A Little Help needed climbing 3-5 steps with a railing? : A Lot 6 Click Score: 18    End of Session Equipment Utilized During Treatment: Gait belt Activity Tolerance: Patient limited by pain Patient left: in bed;with bed alarm set;with call bell/phone within reach Nurse Communication: Mobility status PT Visit Diagnosis: Other  abnormalities of gait and mobility (R26.89);Pain Pain - Right/Left: Right Pain - part of body: Leg     Time: 7616-0737 PT Time Calculation (min) (ACUTE ONLY): 21 min  Charges:  $Therapeutic Activity: 8-22 mins                     Bonney Leitz , PTA Acute Rehabilitation Services Pager [phone] Office [phone]     Isaura Schiller Artis Delay 05/05/2020, 5:28 PM

## 2020-05-06 DIAGNOSIS — S92321B Displaced fracture of second metatarsal bone, right foot, initial encounter for open fracture: Secondary | ICD-10-CM

## 2020-05-06 DIAGNOSIS — M869 Osteomyelitis, unspecified: Secondary | ICD-10-CM

## 2020-05-06 LAB — BASIC METABOLIC PANEL
Anion gap: 8 (ref 5–15)
BUN: 9 mg/dL (ref 6–20)
CO2: 24 mmol/L (ref 22–32)
Calcium: 9.1 mg/dL (ref 8.9–10.3)
Chloride: 104 mmol/L (ref 98–111)
Creatinine, Ser: 0.83 mg/dL (ref 0.61–1.24)
GFR calc Af Amer: >60 mL/min (ref >60)
GFR calc non Af Amer: >60 mL/min (ref >60)
Glucose, Bld: 141 mg/dL — ABNORMAL HIGH (ref 70–99)
Potassium: 4.1 mmol/L (ref 3.5–5.1)
Sodium: 136 mmol/L (ref 135–145)

## 2020-05-06 LAB — CBC
HCT: 37.5 % — ABNORMAL LOW (ref 39.0–52.0)
Hemoglobin: 11.7 g/dL — ABNORMAL LOW (ref 13.0–17.0)
MCH: 24.7 pg — ABNORMAL LOW (ref 26.0–34.0)
MCHC: 31.2 g/dL (ref 30.0–36.0)
MCV: 79.3 fL — ABNORMAL LOW (ref 80.0–100.0)
Platelets: 221 10*3/uL (ref 150–400)
RBC: 4.73 MIL/uL (ref 4.22–5.81)
RDW: 14.6 % (ref 11.5–15.5)
WBC: 10.6 10*3/uL — ABNORMAL HIGH (ref 4.0–10.5)
nRBC: 0 % (ref 0.0–0.2)

## 2020-05-06 LAB — SURGICAL PATHOLOGY

## 2020-05-06 MED ORDER — DOCUSATE SODIUM 100 MG PO CAPS: 100.0000 mg | ORAL_CAPSULE | Freq: Two times a day (BID) | ORAL | 0 refills | Status: AC

## 2020-05-06 MED ORDER — GABAPENTIN 100 MG PO CAPS
100.0000 mg | ORAL_CAPSULE | Freq: Every day | ORAL | 0 refills | Status: AC
Start: 2020-05-06 — End: 2020-06-05

## 2020-05-06 MED ORDER — FERROUS GLUCONATE 324 (38 FE) MG PO TABS: 324.0000 mg | ORAL_TABLET | Freq: Every day | ORAL | 0 refills | Status: AC

## 2020-05-06 MED ORDER — POLYETHYLENE GLYCOL 3350 17 G PO PACK: 17.0000 g | PACK | Freq: Every day | ORAL | 0 refills | Status: AC | PRN

## 2020-05-06 MED ORDER — OXYCODONE-ACETAMINOPHEN 5-325 MG PO TABS: 1.0000 | ORAL_TABLET | ORAL | 0 refills | Status: AC | PRN

## 2020-05-06 MED FILL — OXYCODONE-APAP 5-325MG: 5-325 | 5 days supply | Qty: 30 | Fill #0

## 2020-05-06 MED FILL — POLYETHYLENE GLYCOL 3350 PO: 17 | 14 days supply | Qty: 238 | Fill #0

## 2020-05-06 MED FILL — DOCUSATE SODIUM 100 MG CAPS: 100 | 30 days supply | Qty: 60 | Fill #0

## 2020-05-06 MED FILL — GABAPENTIN 100 MG CAPSULE: 100 | 30 days supply | Qty: 30 | Fill #0

## 2020-05-06 MED FILL — FERROUS GLUCONATE 324 MG TA: 324 (38 FE) | 30 days supply | Qty: 30 | Fill #0

## 2020-05-06 NOTE — TOC Initial Note (Addendum)
Transition of Care [facility]) - Initial/Assessment Note    Patient Details  Name: [name] MRN: [id number] Date of Birth: [birth year]  [facility] CM/SW Contact:    Kingsley Plan, RN Phone Number: 05/06/2020, 1:14 PM  Clinical Narrative:                 Spoke to patient at bedside . Confirmed face sheet information with patient.   PCP is Dr Lorelei Pont.  PAtient lives with his adult son.   Orders for home health PT and walker.   Called patient's insurance 1 [id number] spoke with Gerri Spore and Corrin Parker regarding  home health PT and walker.   Instructed to get an accepting home health agency, then the agency has to complete prior authorization form.  [facility] )  is not in network.   [facility] does not cover patient's address.   [facility] ( [place]) does not service address. [facility]) they do not service address.  [facility] [place]) they do not accept insurance.   [facility] [phone] Burna Mortimer ) not in network with insurance.  [facility] Susann Givens can accept referral . Referral faxed to [facility] at [phone] fax .  [facility] can provide walker, spoke with Jasmine December. Fax [phone] . Order for walker has to have DX on it. Order faxed  Shaniaqua at [facility] [phone] ext [phone] aware   Expected Discharge Plan: Home w Home Health Services Barriers to Discharge: No Barriers Identified   Patient Goals and CMS Choice Patient states their goals for this hospitalization and ongoing recovery are:: to return to home CMS Medicare.gov Compare Post Acute Care list provided to:: Patient Choice offered to / list presented to : Patient  Expected Discharge Plan and Services Expected Discharge Plan: Home w Home Health Services In-house Referral: Clinical Social Work Discharge Planning Services: CM Consult Post Acute  Care Choice: Home Health, Durable Medical Equipment Living arrangements for the past 2 months: Single Family Home Expected Discharge Date: 05/06/20               DME Arranged: Dan Humphreys rolling         HH Arranged: PT          Prior Living Arrangements/Services Living arrangements for the past 2 months: Single Family Home Lives with:: Adult Children Patient language and need for interpreter reviewed:: Yes Do you feel safe going back to the place where you live?: Yes      Need for Family Participation in Patient Care: Yes (Comment) Care giver support system in place?: Yes (comment)   Criminal Activity/Legal Involvement Pertinent to Current Situation/Hospitalization: No - Comment as needed  Activities of Daily Living      Permission Sought/Granted Permission sought to share information with : Family Supports (requested to call pt son- pt declined) Permission granted to share information with : No              Emotional Assessment Appearance:: Appears stated age Attitude/Demeanor/Rapport: Engaged Affect (typically observed): Accepting Orientation: : Oriented to Self, Oriented to Place, Oriented to  Time, Oriented to Situation Alcohol / Substance Use: Not Applicable Psych Involvement: No (comment)  Admission diagnosis:  Foot osteomyelitis, right (HCC) [M86.9] Open displaced fracture of second metatarsal bone of right foot, initial encounter [S92.321B] Osteomyelitis of right foot, unspecified type (HCC) [D78.9]  Patient Active Problem List   Diagnosis Date Noted  . Foot osteomyelitis, right (HCC) 05/01/2020   PCP:  Patient, No Pcp Per Pharmacy:   [id number] [place], [place] - [address] AT [facility] OF [address] & [address] [place] [address] Phone: [phone] Fax: [phone]  [facility] [place], [place] - [address] [place] [address] Phone:  [phone] Fax: [phone]     Social Determinants of Health (SDOH) Interventions    Readmission Risk Interventions No flowsheet data found.

## 2020-05-06 NOTE — Discharge Summary (Signed)
Physician Discharge Summary  [name] ZOX:[id number] DOB: [birth year] DOA: 05/01/2020  PCP: Patient, No Pcp Per  Admit date: 05/01/2020 Discharge date: 05/06/2020  Admitted From: Home Disposition:  Home  Recommendations for Outpatient Follow-up:  1. Follow up with PCP in 2-3 weeks 2. Follow up with Orthopedic Surgery as scheduled  Home Health:PT  Equipment/Devices:Rolling walker    Discharge Condition:Stable CODE STATUS:Full Diet recommendation: Regular   Brief/Interim Summary: 49 year old male with history of obesity, tobacco use, chronic back pain who came into the ED due to worsening swelling, pain and drainage out of his right foot.  Patient has been having problems with the right foot on and off for the past couple years, at one point he was following with podiatry, however in the last 2 weeks he appreciates that he developed a new drainage and swelling and decided to come to the hospital.  Patient was placed on IV antibiotics, CT scan of the foot showed extensive osteomyelitis/tissue infection, concern for septic arthritis.  Orthopedic surgery was consulted and patient is status post BKA on 6/27.  Postop course complicated by severe pain requiring PCA Dilaudid pump.  Discharge Diagnoses:  Active Problems:   Foot osteomyelitis, right (HCC)  Right foot osteomyelitis-patient was started on antibiotics, continue, appreciate orthopedic surgery consultation.  Dr. Lajoyce Corners with orthopedic surgery has been consulted and patient is status post BKA on 6/27.  Postoperative course complicated by excruciating pain requiring PCA pump with Dilaudid on 6/27-6/28 overnight. Eventually transitioned to oral narcotics. On the day of d/c patient stated pain was much improved and pt was eager for discharge home. Pt to follow up after discharge. Orthopedic surgery to prescribe narcotic regimen. Will supplement pain regimen with low dose neuroning  Active Problems Obesity-BMI 35.2, he will need to  lose weight which will also help when he will be fitted with a right foot prosthesis  Chronic back pain-not an issue right now  Nicotine abuse-he seems determined to quit, continue nicotine patch while in hospital  Anemia, iron deficiency-started iron  Hyperglycemia-no history of diabetes, A1c suggests prediabetes  Elevated blood pressure-likely due to pain, now improved this his pain is better controlled.  Does not look like he needs antihypertensives currently   Discharge Instructions   Allergies as of 05/06/2020      Reactions   Ceftriaxone Diarrhea, Nausea Only, Other (See Comments)   Nausea, diarrhea.       Medication List    TAKE these medications   docusate sodium 100 MG capsule Commonly known as: COLACE Take 1 capsule (100 mg total) by mouth 2 (two) times daily.   ferrous gluconate 324 MG tablet Commonly known as: FERGON Take 1 tablet (324 mg total) by mouth daily with breakfast. Start taking on: May 07, 2020   gabapentin 100 MG capsule Commonly known as: Neurontin Take 1 capsule (100 mg total) by mouth daily.   polyethylene glycol 17 g packet Commonly known as: MIRALAX / GLYCOLAX Take 17 g by mouth daily as needed for mild constipation.            Durable Medical Equipment  (From admission, onward)         Start     Ordered   05/06/20 0925  For home use only DME Walker rolling  Once       Question Answer Comment  Walker: With 5 Inch Wheels   Patient needs a walker to treat with the following condition Right below-knee amputee (HCC)      05/06/20 8119  Follow-up Information    Nadara Mustarduda, Marcus V, MD In 1 week.   Specialty: Orthopedic Surgery Contact information: [address] [place] [address] [phone]        Follow up with your PCP in 2-3 weeks. Schedule an appointment as soon as possible for a visit.              Allergies  Allergen Reactions  . Ceftriaxone Diarrhea, Nausea Only and Other (See Comments)     Nausea, diarrhea.     Consultations:  Orthopedic Surgery  Procedures/Studies: CT FOOT RIGHT W CONTRAST  Result Date: 05/01/2020 CLINICAL DATA:  Right foot pain, swelling and drainage since an injury 1 year ago. Wound on the plantar surface of the foot at the second toe. EXAM: CT OF THE LOWER RIGHT EXTREMITY WITH CONTRAST TECHNIQUE: Multidetector CT imaging of the lower right extremity was performed according to the standard protocol following intravenous contrast administration. COMPARISON:  Plain films right foot 05/01/2020. CONTRAST:  75 mL OMNIPAQUE IOHEXOL 300 MG/ML  SOLN FINDINGS: Bones/Joint/Cartilage Extensive periosteal reaction is present along the most the entire second metatarsal and proximal phalanx of the second toe. Marked cortical thickening consistent with periosteal reaction is also seen throughout almost the entire third metatarsal. Bony destructive change about the third MTP joint is identified. Mild periosteal reaction base of the proximal phalanx of the third toe is seen. Bony destructive change and irregularity at the IP joint of the great toe is identified. Margins are corticated. Ligaments Suboptimally assessed by CT. Muscles and Tendons There is marked atrophy of intrinsic musculature the foot. No intramuscular fluid collection is identified. Soft tissues Intense subcutaneous edema is present about the foot. There is a wound on the plantar surface of the foot at approximately the level of the second MTP joint. Deep to the wound, there is a fragment of bone measuring 1.3 cm transverse by 1.8 cm long by 1.5 cm craniocaudal which is likely the second metatarsal head. Soft tissue gas and marked swelling are present about the second toe. IMPRESSION: Large skin wound on the plantar surface of the foot with a bone fragment deep to it which appears to be the second metatarsal head. Findings consistent with osteomyelitis throughout almost the entire second and third metatarsals and proximal  phalanges of the second and third toes. Destructive change about the second and third MTP joints is worrisome for septic joints. Destructive change about the IP joint of the great toe is likely due to prior septic joint. Margins are corticated. Electronically Signed   By: Drusilla Kannerhomas  Dalessio M.D.   On: 05/01/2020 12:47   US Venous Img Lower Bilateral  Result Date: 05/01/2020 CLINICAL DATA:  Bilateral lower extremity edema and right foot erythema. EXAM: BILATERAL LOWER EXTREMITY VENOUS DOPPLER ULTRASOUND TECHNIQUE: Gray-scale sonography with graded compression, as well as color Doppler and duplex ultrasound were performed to evaluate the lower extremity deep venous systems from the level of the common femoral vein and including the common femoral, femoral, profunda femoral, popliteal and calf veins including the posterior tibial, peroneal and gastrocnemius veins when visible. The superficial great saphenous vein was also interrogated. Spectral Doppler was utilized to evaluate flow at rest and with distal augmentation maneuvers in the common femoral, femoral and popliteal veins. COMPARISON:  Prior right lower extremity study on 03/06/2019 FINDINGS: RIGHT LOWER EXTREMITY Common Femoral Vein: No evidence of thrombus. Normal compressibility, respiratory phasicity and response to augmentation. Saphenofemoral Junction: No evidence of thrombus. Normal compressibility and flow on color Doppler imaging.  Profunda Femoral Vein: No evidence of thrombus. Normal compressibility and flow on color Doppler imaging. Femoral Vein: No evidence of thrombus. Normal compressibility, respiratory phasicity and response to augmentation. Popliteal Vein: No evidence of thrombus. Normal compressibility, respiratory phasicity and response to augmentation. Calf Veins: No evidence of thrombus. Normal compressibility and flow on color Doppler imaging. Superficial Great Saphenous Vein: No evidence of thrombus. Normal compressibility. Venous Reflux:   None. Other Findings: Stable appearance by ultrasound of mildly prominent right inguinal lymph nodes. No evidence of superficial thrombophlebitis. No abnormal fluid collections. LEFT LOWER EXTREMITY Common Femoral Vein: No evidence of thrombus. Normal compressibility, respiratory phasicity and response to augmentation. Saphenofemoral Junction: No evidence of thrombus. Normal compressibility and flow on color Doppler imaging. Profunda Femoral Vein: No evidence of thrombus. Normal compressibility and flow on color Doppler imaging. Femoral Vein: No evidence of thrombus. Normal compressibility, respiratory phasicity and response to augmentation. Popliteal Vein: No evidence of thrombus. Normal compressibility, respiratory phasicity and response to augmentation. Calf Veins: No evidence of thrombus. Normal compressibility and flow on color Doppler imaging. Superficial Great Saphenous Vein: No evidence of thrombus. Normal compressibility. Venous Reflux:  None. Other Findings: No evidence of superficial thrombophlebitis or abnormal fluid collection. IMPRESSION: No evidence of deep venous thrombosis in either lower extremity. Electronically Signed   By: Irish Lack M.D.   On: 05/01/2020 09:42   DG Foot Complete Right  Result Date: 05/01/2020 CLINICAL DATA:  Follow-up right foot ulcer with increased swelling and drainage EXAM: RIGHT FOOT COMPLETE - 3+ VIEW COMPARISON:  03/06/2019 FINDINGS: Considerable soft tissue swelling is noted in the right foot particularly within the second digit. Increased sclerosis in the second proximal phalanx is noted. There is interval fracture through the distal aspect of the second metatarsal with displacement of the metatarsal head. This is new from the prior exam. Additionally some callus formation is noted as well as bony destructive change. Deformity of the third metatarsal is noted as well. Previously seen soft tissue wound is not as well appreciated. IMPRESSION: Changes consistent  with interval fracture in the distal aspect of the second metatarsal. Associated soft tissue swelling and bony erosive changes are noted highly suspicious for osteomyelitis. MRI would be helpful for further evaluation. Chronic deformity of the third metatarsal is seen which may be related to prior fracture and healing. Electronically Signed   By: Alcide Clever M.D.   On: 05/01/2020 08:19     Subjective: Eager to go home today  Discharge Exam: Vitals:   05/05/20 2124 05/06/20 0538  BP: 108/74 120/72  Pulse: 79 71  Resp: 19 20  Temp: 98.8 F (37.1 C) 98.7 F (37.1 C)  SpO2: 97% 98%   Vitals:   05/05/20 1222 05/05/20 1700 05/05/20 2124 05/06/20 0538  BP: (!) 112/59 112/64 108/74 120/72  Pulse: 82 79 79 71  Resp: 16 16 19 20   Temp: 98.8 F (37.1 C) 99.1 F (37.3 C) 98.8 F (37.1 C) 98.7 F (37.1 C)  TempSrc: Oral Oral Oral Oral  SpO2: 98% 99% 97% 98%  Weight:      Height:        General: Pt is alert, awake, not in acute distress Cardiovascular: RRR, S1/S2 +, no rubs, no gallops Respiratory: CTA bilaterally, no wheezing, no rhonchi Abdominal: Soft, NT, ND, bowel sounds + Extremities: no edema, no cyanosis, s/p RLE amputation   The results of significant diagnostics from this hospitalization (including imaging, microbiology, ancillary and laboratory) are listed below for reference.  Microbiology: Recent Results (from the past 240 hour(s))  Culture, blood (routine x 2)     Status: None (Preliminary result)   Collection Time: 05/01/20  7:39 AM   Specimen: BLOOD  Result Value Ref Range Status   Specimen Description BLOOD SITE NOT SPECIFIED DRAWN BY RN  Final   Special Requests   Final    BOTTLES DRAWN AEROBIC AND ANAEROBIC Blood Culture results may not be optimal due to an inadequate volume of blood received in culture bottles   Culture   Final    NO GROWTH 4 DAYS Performed at [facility], [address], [place], [place] [address]    Report Status PENDING   Incomplete  SARS Coronavirus 2 by RT PCR (hospital order, performed in [facility] hospital lab) Nasopharyngeal Nasopharyngeal Swab     Status: None   Collection Time: 05/01/20 11:48 AM   Specimen: Nasopharyngeal Swab  Result Value Ref Range Status   SARS Coronavirus 2 NEGATIVE NEGATIVE Final    Comment: (NOTE) SARS-CoV-2 target nucleic acids are NOT DETECTED.  The SARS-CoV-2 RNA is generally detectable in upper and lower respiratory specimens during the acute phase of infection. The lowest concentration of SARS-CoV-2 viral copies this assay can detect is 250 copies / mL. A negative result does not preclude SARS-CoV-2 infection and should not be used as the sole basis for treatment or other patient management decisions.  A negative result may occur with improper specimen collection / handling, submission of specimen other than nasopharyngeal swab, presence of viral mutation(s) within the areas targeted by this assay, and inadequate number of viral copies (<250 copies / mL). A negative result must be combined with clinical observations, patient history, and epidemiological information.  Fact Sheet for Patients:   BoilerBrush.com.cy  Fact Sheet for Healthcare Providers: https://pope.com/  This test is not yet approved or  cleared by the [place] FDA and has been authorized for detection and/or diagnosis of SARS-CoV-2 by FDA under an Emergency Use Authorization (EUA).  This EUA will remain in effect (meaning this test can be used) for the duration of the COVID-19 declaration under Section 564(b)(1) of the Act, 21 U.S.C. section 360bbb-3(b)(1), unless the authorization is terminated or revoked sooner.  Performed at [facility], [address], [place], [place] [address]   Surgical pcr screen     Status: Abnormal   Collection Time: 05/02/20  5:08 PM   Specimen: Nasal Mucosa; Nasal Swab  Result Value Ref Range Status   MRSA, PCR  POSITIVE (A) NEGATIVE Final    Comment: RESULT CALLED TO, READ BACK BY AND VERIFIED WITH: SISON,C RN 05/02/2020 AT 2018 SKEEN,P    Staphylococcus aureus POSITIVE (A) NEGATIVE Final    Comment: (NOTE) The Xpert SA Assay (FDA approved for NASAL specimens in patients 72 years of age and older), is one component of a comprehensive surveillance program. It is not intended to diagnose infection nor to guide or monitor treatment. Performed at [facility], [address], [place], [place] [address]      Labs: BNP (last 3 results) No results for input(s): BNP in the last 8760 hours. Basic Metabolic Panel: Recent Labs  Lab 05/01/20 0739 05/01/20 0739 05/02/20 0300 05/03/20 0544 05/04/20 1121 05/05/20 0242 05/06/20 0306  NA 133*  --  136 137  --  137 136  K 3.7  --  5.2* 3.7  --  4.2 4.1  CL 97*  --  100 101  --  101 104  CO2 26  --  27 25  --  28 24  GLUCOSE 105*  --  108* 102*  --  126* 141*  BUN 13  --  8 6  --  6 9  CREATININE 0.95   < > 0.88 0.82 0.89 0.84 0.83  CALCIUM 8.4*  --  8.5* 8.9  --  8.8* 9.1  MG 2.0  --   --   --   --  2.0  --    < > = values in this interval not displayed.   Liver Function Tests: Recent Labs  Lab 05/01/20 0739 05/03/20 0544 05/05/20 0242  AST 37 29 30  ALT 27 24 21   ALKPHOS 72 62 51  BILITOT 0.3 0.6 0.4  PROT 8.1 7.6 7.2  ALBUMIN 3.1* 2.7* 2.6*   No results for input(s): LIPASE, AMYLASE in the last 168 hours. No results for input(s): AMMONIA in the last 168 hours. CBC: Recent Labs  Lab 05/01/20 0739 05/02/20 0300 05/03/20 0544 05/05/20 0242 05/06/20 0306  WBC 7.6 5.6 6.6 8.3 10.6*  NEUTROABS 4.6  --   --   --   --   HGB 10.9*  10.8* 11.3* 12.5* 11.2* 11.7*  HCT 35.2* 37.2* 39.0 36.6* 37.5*  MCV 83.4 82.5 79.8* 81.5 79.3*  PLT 202 160 198 186 221   Cardiac Enzymes: No results for input(s): CKTOTAL, CKMB, CKMBINDEX, TROPONINI in the last 168 hours. BNP: Invalid input(s): POCBNP CBG: Recent Labs  Lab 05/03/20 0841   GLUCAP 104*   D-Dimer No results for input(s): DDIMER in the last 72 hours. Hgb A1c No results for input(s): HGBA1C in the last 72 hours. Lipid Profile No results for input(s): CHOL, HDL, LDLCALC, TRIG, CHOLHDL, LDLDIRECT in the last 72 hours. Thyroid function studies No results for input(s): TSH, T4TOTAL, T3FREE, THYROIDAB in the last 72 hours.  Invalid input(s): FREET3 Anemia work up No results for input(s): VITAMINB12, FOLATE, FERRITIN, TIBC, IRON, RETICCTPCT in the last 72 hours. Urinalysis No results found for: COLORURINE, APPEARANCEUR, LABSPEC, PHURINE, GLUCOSEU, HGBUR, BILIRUBINUR, KETONESUR, PROTEINUR, UROBILINOGEN, NITRITE, LEUKOCYTESUR Sepsis Labs Invalid input(s): PROCALCITONIN,  WBC,  LACTICIDVEN Microbiology Recent Results (from the past 240 hour(s))  Culture, blood (routine x 2)     Status: None (Preliminary result)   Collection Time: 05/01/20  7:39 AM   Specimen: BLOOD  Result Value Ref Range Status   Specimen Description BLOOD SITE NOT SPECIFIED DRAWN BY RN  Final   Special Requests   Final    BOTTLES DRAWN AEROBIC AND ANAEROBIC Blood Culture results may not be optimal due to an inadequate volume of blood received in culture bottles   Culture   Final    NO GROWTH 4 DAYS Performed at [facility], [address], [place], [place]    Report Status PENDING  Incomplete  SARS Coronavirus 2 by RT PCR (hospital order, performed in [facility] hospital lab) Nasopharyngeal Nasopharyngeal Swab     Status: None   Collection Time: 05/01/20 11:48 AM   Specimen: Nasopharyngeal Swab  Result Value Ref Range Status   SARS Coronavirus 2 NEGATIVE NEGATIVE Final    Comment: (NOTE) SARS-CoV-2 target nucleic acids are NOT DETECTED.  The SARS-CoV-2 RNA is generally detectable in upper and lower respiratory specimens during the acute phase of infection. The lowest concentration of SARS-CoV-2 viral copies this assay can detect is 250 copies / mL. A negative result does  not preclude SARS-CoV-2 infection and should not be used as the sole basis for treatment or other patient management  decisions.  A negative result may occur with improper specimen collection / handling, submission of specimen other than nasopharyngeal swab, presence of viral mutation(s) within the areas targeted by this assay, and inadequate number of viral copies (<250 copies / mL). A negative result must be combined with clinical observations, patient history, and epidemiological information.  Fact Sheet for Patients:   BoilerBrush.com.cy  Fact Sheet for Healthcare Providers: https://pope.com/  This test is not yet approved or  cleared by the [place] FDA and has been authorized for detection and/or diagnosis of SARS-CoV-2 by FDA under an Emergency Use Authorization (EUA).  This EUA will remain in effect (meaning this test can be used) for the duration of the COVID-19 declaration under Section 564(b)(1) of the Act, 21 U.S.C. section 360bbb-3(b)(1), unless the authorization is terminated or revoked sooner.  Performed at [facility], [address] [place], [place] [address]   Surgical pcr screen     Status: Abnormal   Collection Time: 05/02/20  5:08 PM   Specimen: Nasal Mucosa; Nasal Swab  Result Value Ref Range Status   MRSA, PCR POSITIVE (A) NEGATIVE Final    Comment: RESULT CALLED TO, READ BACK BY AND VERIFIED WITH: SISON,C RN 05/02/2020 AT 2018 SKEEN,P    Staphylococcus aureus POSITIVE (A) NEGATIVE Final    Comment: (NOTE) The Xpert SA Assay (FDA approved for NASAL specimens in patients 59 years of age and older), is one component of a comprehensive surveillance program. It is not intended to diagnose infection nor to guide or monitor treatment. Performed at [facility], [address], [place], [place] [address]    Time spent: 30 min  SIGNED:   Rickey Barbara, MD  [facility] 05/06/2020, 9:29  AM  If 7PM-7AM, please contact night-coverage

## 2020-05-06 NOTE — Plan of Care (Signed)

## 2020-05-06 NOTE — Social Work (Signed)
Confirmed with [employer] member services that pt has HH benefits but they require prior authorization, form printed will work with [facility] to arrange. Call ref #[id number]  Octavio Graves, MSW, LCSW [facility]

## 2020-05-07 LAB — CULTURE, BLOOD (ROUTINE X 2): Culture: NO GROWTH

## 2020-06-24 ENCOUNTER — Emergency Department (HOSPITAL_COMMUNITY)
Admission: EM | Admit: 2020-06-24 | Discharge: 2020-06-25 | Disposition: A | Discharge status: Home / Self Care | Payer: Medicaid Other | Attending: Emergency Medicine | Admitting: Emergency Medicine

## 2020-06-24 DIAGNOSIS — Z5321 Procedure and treatment not carried out due to patient leaving prior to being seen by health care provider: Secondary | ICD-10-CM | POA: Insufficient documentation

## 2020-06-24 DIAGNOSIS — Z4801 Encounter for change or removal of surgical wound dressing: Secondary | ICD-10-CM | POA: Insufficient documentation

## 2020-06-24 NOTE — ED Triage Notes (Signed)
Pt had right BKA on 6/21, reports recurrent falls at home, as well as drainage to his wound. Pt reports with most recent fall some of his staples came out. Pt dressed wound himself, drainage noted to dressing, foul odor as well.

## 2020-06-25 ENCOUNTER — Encounter (HOSPITAL_COMMUNITY): Payer: Self-pay | Admitting: Emergency Medicine

## 2020-06-25 ENCOUNTER — Other Ambulatory Visit: Payer: Self-pay

## 2020-06-25 LAB — URINALYSIS, ROUTINE W REFLEX MICROSCOPIC
Glucose, UA: NEGATIVE mg/dL
Hgb urine dipstick: NEGATIVE
Ketones, ur: NEGATIVE mg/dL
Leukocytes,Ua: NEGATIVE
Nitrite: NEGATIVE
Protein, ur: NEGATIVE mg/dL
Specific Gravity, Urine: 1.028 (ref 1.005–1.030)
pH: 5 (ref 5.0–8.0)

## 2020-06-25 LAB — COMPREHENSIVE METABOLIC PANEL
ALT: 26 U/L (ref 0–44)
AST: 37 U/L (ref 15–41)
Albumin: 3.3 g/dL — ABNORMAL LOW (ref 3.5–5.0)
Alkaline Phosphatase: 65 U/L (ref 38–126)
Anion gap: 10 (ref 5–15)
BUN: 9 mg/dL (ref 6–20)
CO2: 27 mmol/L (ref 22–32)
Calcium: 9.1 mg/dL (ref 8.9–10.3)
Chloride: 99 mmol/L (ref 98–111)
Creatinine, Ser: 1.04 mg/dL (ref 0.61–1.24)
GFR calc Af Amer: >60 mL/min (ref >60)
GFR calc non Af Amer: >60 mL/min (ref >60)
Glucose, Bld: 105 mg/dL — ABNORMAL HIGH (ref 70–99)
Potassium: 3.6 mmol/L (ref 3.5–5.1)
Sodium: 136 mmol/L (ref 135–145)
Total Bilirubin: 0.5 mg/dL (ref 0.3–1.2)
Total Protein: 7.9 g/dL (ref 6.5–8.1)

## 2020-06-25 LAB — CBC WITH DIFFERENTIAL/PLATELET
Abs Immature Granulocytes: 0.02 10*3/uL (ref 0.00–0.07)
Basophils Absolute: 0 10*3/uL (ref 0.0–0.1)
Basophils Relative: 1 %
Eosinophils Absolute: 0.3 10*3/uL (ref 0.0–0.5)
Eosinophils Relative: 5 %
HCT: 34.4 % — ABNORMAL LOW (ref 39.0–52.0)
Hemoglobin: 10.3 g/dL — ABNORMAL LOW (ref 13.0–17.0)
Immature Granulocytes: 0 %
Lymphocytes Relative: 32 %
Lymphs Abs: 2 10*3/uL (ref 0.7–4.0)
MCH: 24.1 pg — ABNORMAL LOW (ref 26.0–34.0)
MCHC: 29.9 g/dL — ABNORMAL LOW (ref 30.0–36.0)
MCV: 80.6 fL (ref 80.0–100.0)
Monocytes Absolute: 0.4 10*3/uL (ref 0.1–1.0)
Monocytes Relative: 7 %
Neutro Abs: 3.4 10*3/uL (ref 1.7–7.7)
Neutrophils Relative %: 55 %
Platelets: 207 10*3/uL (ref 150–400)
RBC: 4.27 MIL/uL (ref 4.22–5.81)
RDW: 14.7 % (ref 11.5–15.5)
WBC: 6 10*3/uL (ref 4.0–10.5)
nRBC: 0 % (ref 0.0–0.2)

## 2020-06-25 LAB — LACTIC ACID, PLASMA: Lactic Acid, Venous: 1.9 mmol/L (ref 0.5–1.9)

## 2020-06-25 NOTE — ED Notes (Signed)
Pt was requesting a note stating that he was seen in the ER. This NT informed the pt he had to wait to be seen by a doctor in order to receive a note. Pt then became rude and was cussing at this NT, this NT asked the pt to please stop speaking in this manner. Pt stated "You have done made me wait long enough I'm leaving".

## 2020-09-15 IMAGING — US US EXTREM LOW VENOUS
1 series · 13 of 24 positions shown · non-contrast
Comparison: Prior right lower extremity study on 03/06/2019

CLINICAL DATA: Bilateral lower extremity edema and right foot
erythema.



[Series 1: us venous img lower bilat (dvt) · portal-venous · 13 of 76 slices shown]
[im 1/76]
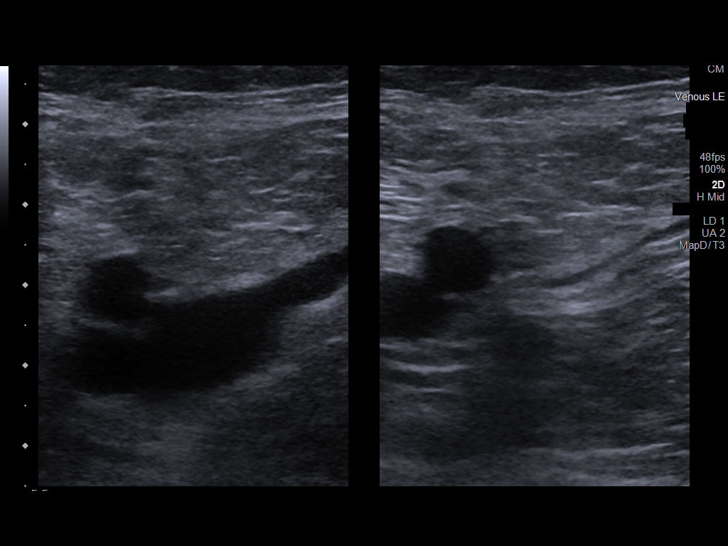
[im 7/76]
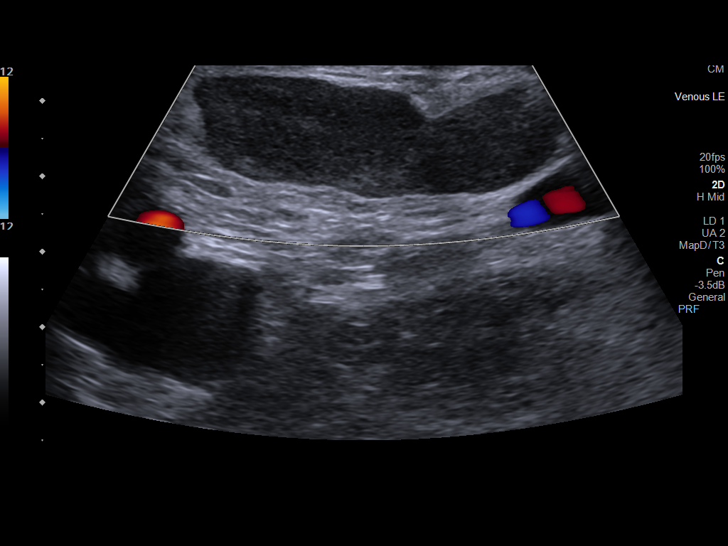
[im 14/76]
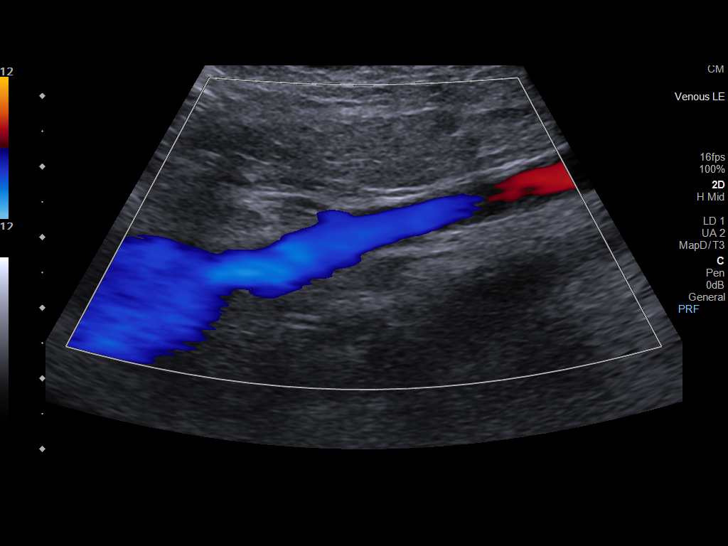
[im 20/76]
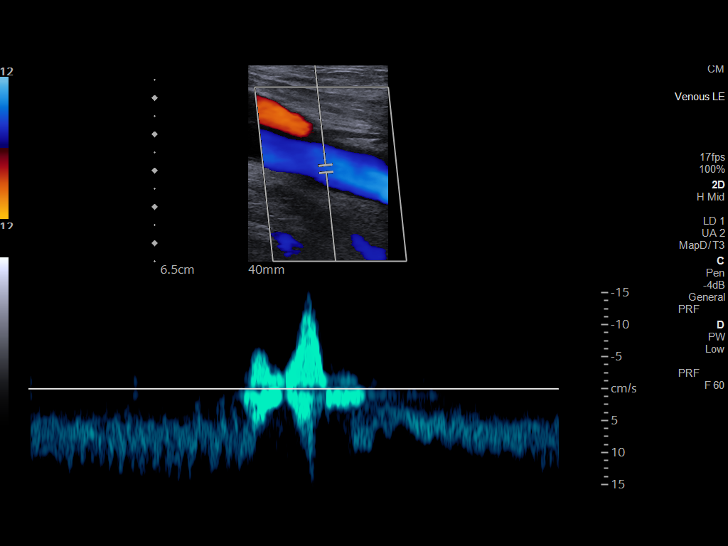
[im 27/76]
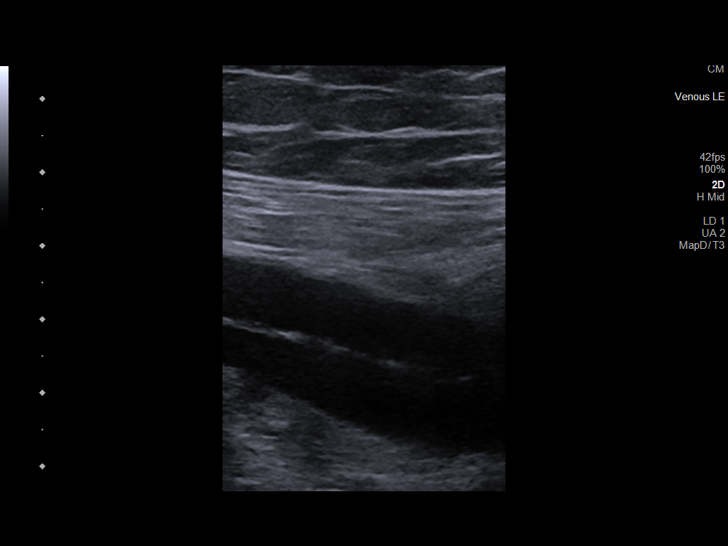
[im 33/76]
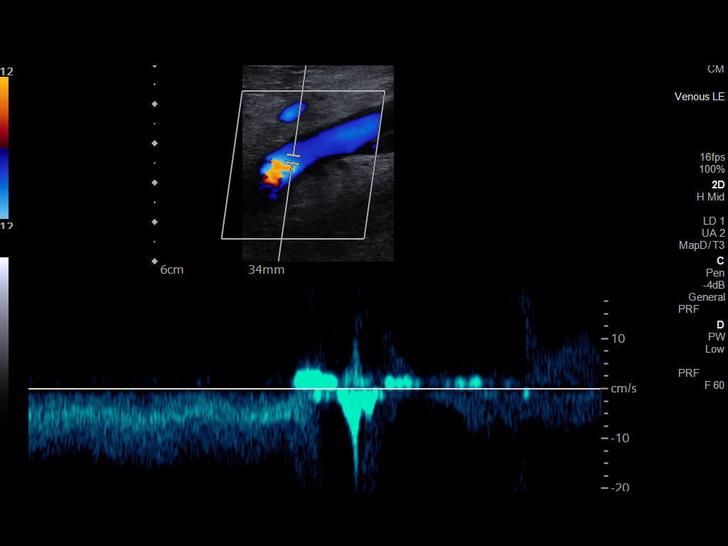
[im 40/76]
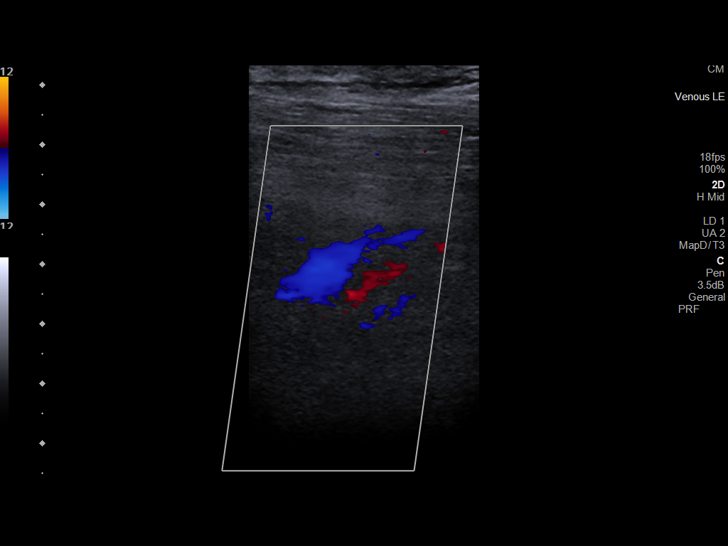
[im 43/76]
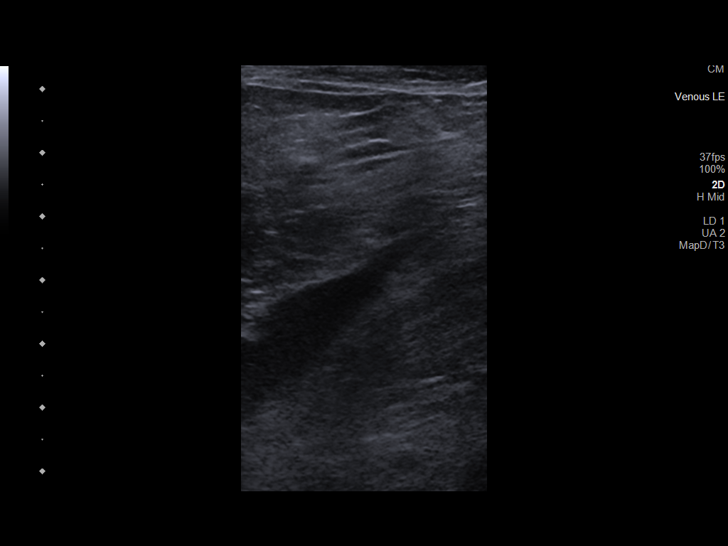
[im 49/76]
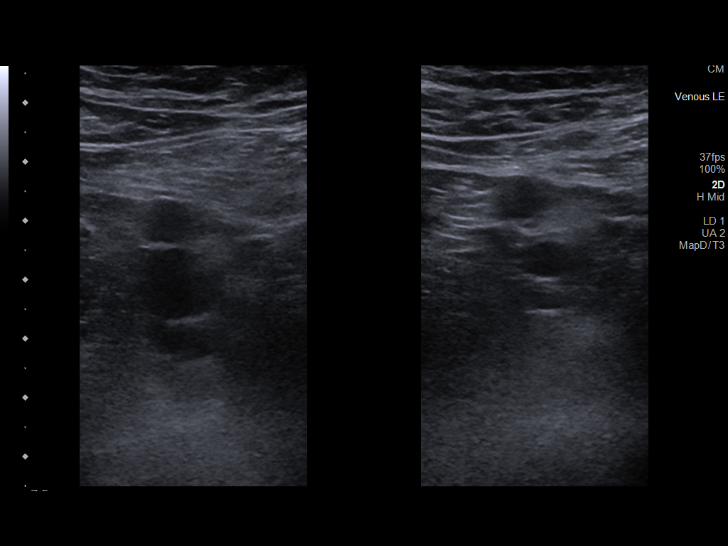
[im 56/76]
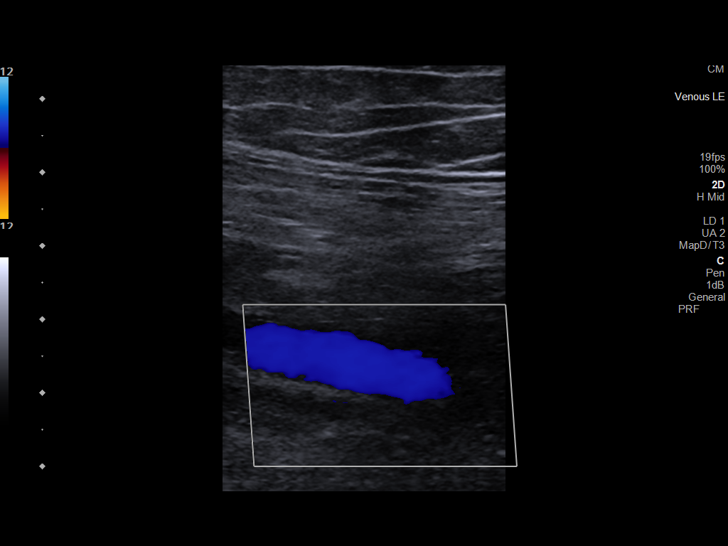
[im 62/76]
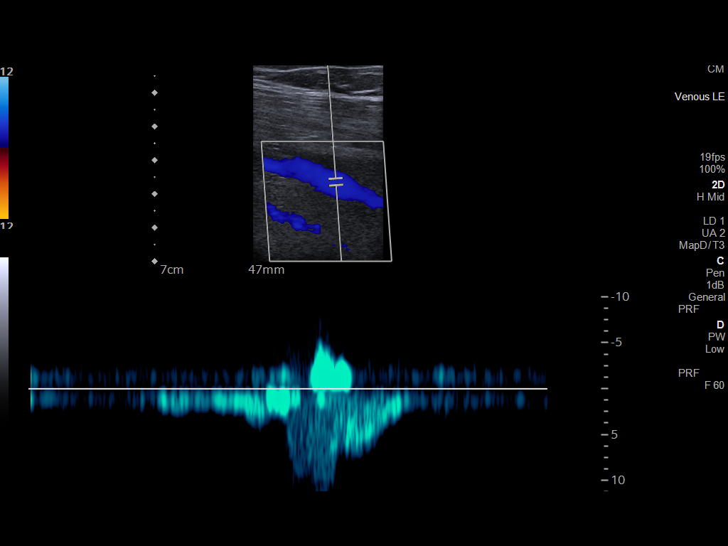
[im 69/76]
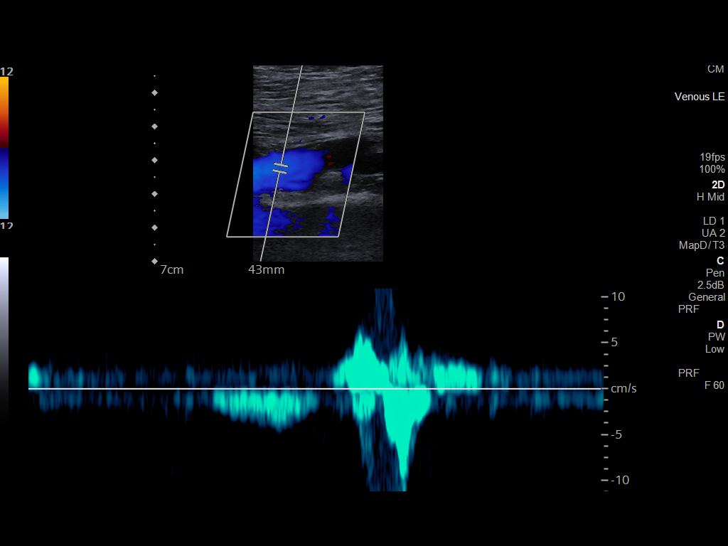
[im 76/76]
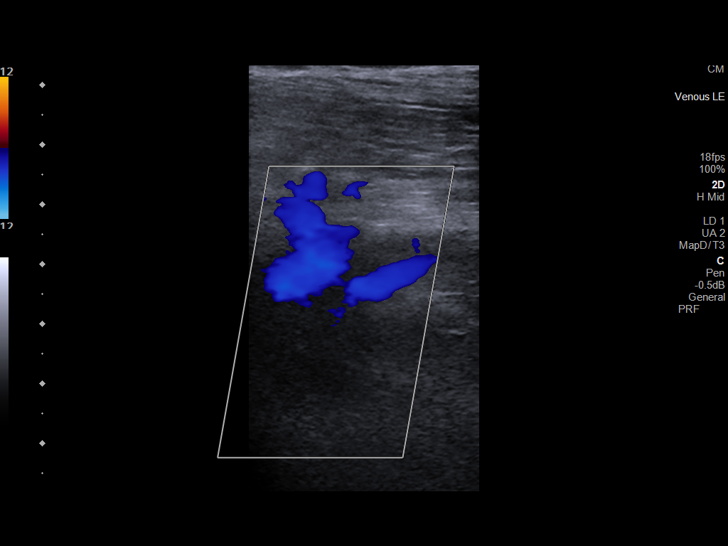

[13 of 24 positions shown; findings below may reference images not displayed]

FINDINGS: RIGHT LOWER EXTREMITY

Common Femoral Vein: No evidence of thrombus. Normal
compressibility, respiratory phasicity and response to augmentation.

Saphenofemoral Junction: No evidence of thrombus. Normal
compressibility and flow on color Doppler imaging.

Profunda Femoral Vein: No evidence of thrombus. Normal
compressibility and flow on color Doppler imaging.

Femoral Vein: No evidence of thrombus. Normal compressibility,
respiratory phasicity and response to augmentation.

Popliteal Vein: No evidence of thrombus. Normal compressibility,
respiratory phasicity and response to augmentation.

Calf Veins: No evidence of thrombus. Normal compressibility and flow
on color Doppler imaging.

Superficial Great Saphenous Vein: No evidence of thrombus. Normal
compressibility.

Venous Reflux:  None.

Other Findings: Stable appearance by ultrasound of mildly prominent
right inguinal lymph nodes. No evidence of superficial
thrombophlebitis. No abnormal fluid collections.

LEFT LOWER EXTREMITY

Common Femoral Vein: No evidence of thrombus. Normal
compressibility, respiratory phasicity and response to augmentation.

Saphenofemoral Junction: No evidence of thrombus. Normal
compressibility and flow on color Doppler imaging.

Profunda Femoral Vein: No evidence of thrombus. Normal
compressibility and flow on color Doppler imaging.

Femoral Vein: No evidence of thrombus. Normal compressibility,
respiratory phasicity and response to augmentation.

Popliteal Vein: No evidence of thrombus. Normal compressibility,
respiratory phasicity and response to augmentation.

Calf Veins: No evidence of thrombus. Normal compressibility and flow
on color Doppler imaging.

Superficial Great Saphenous Vein: No evidence of thrombus. Normal
compressibility.

Venous Reflux:  None.

Other Findings: No evidence of superficial thrombophlebitis or
abnormal fluid collection.
IMPRESSION: No evidence of deep venous thrombosis in either lower extremity.

## 2023-03-15 ENCOUNTER — Ambulatory Visit: Payer: Medicaid Other | Admitting: Orthopedic Surgery

## 2023-03-16 ENCOUNTER — Ambulatory Visit: Payer: Medicaid Other | Admitting: Orthopedic Surgery

## 2023-03-23 ENCOUNTER — Ambulatory Visit: Payer: Medicaid Other | Admitting: Orthopedic Surgery

## 2023-06-01 ENCOUNTER — Encounter: Payer: Self-pay | Admitting: Orthopedic Surgery

## 2023-06-01 ENCOUNTER — Ambulatory Visit: Payer: Medicaid Other | Admitting: Orthopedic Surgery

## 2023-06-01 DIAGNOSIS — Z89511 Acquired absence of right leg below knee: Secondary | ICD-10-CM | POA: Diagnosis not present

## 2023-06-01 DIAGNOSIS — S88111A Complete traumatic amputation at level between knee and ankle, right lower leg, initial encounter: Secondary | ICD-10-CM

## 2023-06-01 NOTE — Progress Notes (Signed)
Office Visit Note   Patient: [name]           Date of Birth: December 05, 1970           MRN: [id number] Visit Date: 06/01/2023              Requested by: No referring provider defined for this encounter. PCP: Patient, No Pcp Per  Chief Complaint  Patient presents with   Right Leg - Follow-up    Hx Right BKA 2021 Needs new prosthetic Rx.      HPI: Patient is a 52 year old gentleman who is seen for evaluation for right transtibial amputation.  Patient states that he was lost to follow-up and had revision surgery in [place].  Patient currently living in [place].  Assessment & Plan: Visit Diagnoses:  1. Below-knee amputation of right lower extremity, initial encounter [facility])     Plan: Patient was provided a prescription for [place] [facility] for a transtibial prosthesis.  Patient is a K3 level ambulator.  Discussed the patient may need therapy and [place] as well.  Follow-Up Instructions: Return if symptoms worsen or fail to improve.   Ortho Exam  Patient is alert, oriented, no adenopathy, well-dressed, normal affect, normal respiratory effort. Examination the incision is well-healed no cellulitis no drainage no signs of infection.  Patient is a new right transtibial  amputee.  Patient's current comorbidities are not expected to impact the ability to function with the prescribed prosthesis. Patient verbally communicates a strong desire to use a prosthesis. Patient currently requires mobility aids to ambulate without a prosthesis.  Expects not to use mobility aids with a new prosthesis.  Patient is a K3 level ambulator that spends a lot of time walking around on uneven terrain over obstacles, up and down stairs, and ambulates with a variable cadence.     Imaging: No results found. No images are attached to the encounter.  Labs: Lab Results  Component Value Date   HGBA1C 5.9 (H) 05/03/2020   ESRSEDRATE 10 03/06/2019   CRP 3.2 (H) 03/06/2019   REPTSTATUS  05/07/2020 FINAL 05/01/2020   CULT  05/01/2020    NO GROWTH 6 DAYS Performed at [facility], [address], [place], [place] [address]      Lab Results  Component Value Date   ALBUMIN 3.3 (L) 06/25/2020   ALBUMIN 2.6 (L) 05/05/2020   ALBUMIN 2.7 (L) 05/03/2020    Lab Results  Component Value Date   MG 2.0 05/05/2020   MG 2.0 05/01/2020   No results found for: "VD25OH"  No results found for: "PREALBUMIN"    Latest Ref Rng & Units 06/25/2020   12:06 AM 05/06/2020    3:06 AM 05/05/2020    2:42 AM  CBC EXTENDED  WBC 4.0 - 10.5 K/uL 6.0  10.6  8.3   RBC 4.22 - 5.81 MIL/uL 4.27  4.73  4.49   Hemoglobin 13.0 - 17.0 g/dL 56.3  87.5  64.3   HCT 39.0 - 52.0 % 34.4  37.5  36.6   Platelets 150 - 400 K/uL 207  221  186   NEUT# 1.7 - 7.7 K/uL 3.4     Lymph# 0.7 - 4.0 K/uL 2.0        There is no height or weight on file to calculate BMI.  Orders:  No orders of the defined types were placed in this encounter.  No orders of the defined types were placed in this encounter.    Procedures: No procedures performed  Clinical  Data: No additional findings.  ROS:  All other systems negative, except as noted in the HPI. Review of Systems  Objective: Vital Signs: There were no vitals taken for this visit.  Specialty Comments:  No specialty comments available.  PMFS History: Patient Active Problem List   Diagnosis Date Noted   Foot osteomyelitis, right (HCC) 05/01/2020   History reviewed. No pertinent past medical history.  History reviewed. No pertinent family history.  Past Surgical History:  Procedure Laterality Date   AMPUTATION Right 05/03/2020   Procedure: AMPUTATION BELOW KNEE;  Surgeon: Nadara Mustard, MD;  Location: [facility] OR;  Service: Orthopedics;  Laterality: Right;   BACK SURGERY     Social History   Occupational History   Not on file  Tobacco Use   Smoking status: Every Day    Current packs/day: 0.50    Average packs/day: 0.5 packs/day for 20.0 years  (10.0 ttl pk-yrs)    Types: Cigarettes   Smokeless tobacco: Never  Substance and Sexual Activity   Alcohol use: Yes   Drug use: Never   Sexual activity: Not on file

## 2023-06-07 ENCOUNTER — Telehealth: Payer: Self-pay

## 2023-06-07 NOTE — Telephone Encounter (Signed)
This was faxed on 7/26 and refaxed today

## 2023-06-07 NOTE — Telephone Encounter (Signed)
Patient called asking if we have received a fax regarding his Medical records from last week from [facility]  Fax number is [phone]

## 2023-11-16 ENCOUNTER — Ambulatory Visit: Payer: Medicaid Other | Admitting: Orthopedic Surgery

## 2023-12-11 ENCOUNTER — Ambulatory Visit: Payer: Medicaid Other | Admitting: Orthopedic Surgery

## 2023-12-12 ENCOUNTER — Telehealth: Payer: Self-pay | Admitting: Orthopedic Surgery

## 2023-12-12 ENCOUNTER — Encounter: Payer: Self-pay | Admitting: Orthopedic Surgery

## 2023-12-12 ENCOUNTER — Ambulatory Visit (INDEPENDENT_AMBULATORY_CARE_PROVIDER_SITE_OTHER): Payer: Medicaid Other | Admitting: Orthopedic Surgery

## 2023-12-12 DIAGNOSIS — Z89511 Acquired absence of right leg below knee: Secondary | ICD-10-CM | POA: Diagnosis not present

## 2023-12-12 DIAGNOSIS — S88111A Complete traumatic amputation at level between knee and ankle, right lower leg, initial encounter: Secondary | ICD-10-CM

## 2023-12-12 NOTE — Telephone Encounter (Signed)
Pt called. Dr. Lajoyce Corners gave him Rx for [facility] but they need today's ov note faxed [phone]. Patient has Rx but notes are needed to fulfill. Call pt when this is done [phone]

## 2023-12-12 NOTE — Telephone Encounter (Signed)
IC patient, advised notes faxed.

## 2023-12-12 NOTE — Progress Notes (Addendum)
 Office Visit Note   Patient: [name]           Date of Birth: 1971-06-05           MRN: [id number] Visit Date: 12/12/2023              Requested by: No referring provider defined for this encounter. PCP: Patient, No Pcp Per  Chief Complaint  Patient presents with   Right Leg - Follow-up    HX BKA       HPI: Patient is a 53 year old gentleman who is status post right transtibial amputation.  Patient presents for evaluation for prosthetic fitting.  Assessment & Plan: Visit Diagnoses:  1. Below-knee amputation of right lower extremity, initial encounter [name])     Plan: Patient was provided a prescription for a K3 level prosthesis.  Anticipate patient will proceed to follow-up with [place]  [facility] in [place].  Follow-Up Instructions: Return if symptoms worsen or fail to improve.   Ortho Exam  Patient is alert, oriented, no adenopathy, well-dressed, normal affect, normal respiratory effort. Examination the residual limb is well consolidated.  There is no cellulitis no open wounds no signs of infection.   Patient is a new right transtibial  amputee.  Patient's current comorbidities are not expected to impact the ability to function with the prescribed prosthesis. Patient verbally communicates a strong desire to use a prosthesis. Patient currently requires mobility aids to ambulate without a prosthesis.  Expects not to use mobility aids with a new prosthesis. Patient is expected to resume or reach their K Level within 6 months. Patient was active before the amputation and independent with stairs, uneven terrain, varying cadence, and a community ambulator.  Patient is a K2 level ambulator that will use a prosthesis to walk around their home and the community over low level environmental barriers.       [name] is a willing and motivated amputee. [name] is a well healed ambulatory patient who has the potential for ambulation with the ability to traverse  low-level environmental barriers like curbs,stairs or uneven surfaces, typical of a limited community ambulatory. A right transtibial definitive prosthesis with a K2 flexible foot will allow [name] to return to previous ADL'/activities such as auto cleaning,auto repair,cooking hunting and social engagement. [name] currently uses 1-walk crutches as an assistive device for ambulation    Imaging: No results found. No images are attached to the encounter.  Labs: Lab Results  Component Value Date   HGBA1C 5.9 (H) 05/03/2020   ESRSEDRATE 10 03/06/2019   CRP 3.2 (H) 03/06/2019   REPTSTATUS 05/07/2020 FINAL 05/01/2020   CULT  05/01/2020    NO GROWTH 6 DAYS Performed at [facility], [address], [place], [place] [address]      Lab Results  Component Value Date   ALBUMIN 3.3 (L) 06/25/2020   ALBUMIN 2.6 (L) 05/05/2020   ALBUMIN 2.7 (L) 05/03/2020    Lab Results  Component Value Date   MG 2.0 05/05/2020   MG 2.0 05/01/2020   No results found for: "VD25OH"  No results found for: "PREALBUMIN"    Latest Ref Rng & Units 06/25/2020   12:06 AM 05/06/2020    3:06 AM 05/05/2020    2:42 AM  CBC EXTENDED  WBC 4.0 - 10.5 K/uL 6.0  10.6  8.3   RBC 4.22 - 5.81 MIL/uL 4.27  4.73  4.49   Hemoglobin 13.0 - 17.0 g/dL 19.1  47.8  29.5   HCT 39.0 - 52.0 % 34.4  37.5  36.6   Platelets 150 - 400 K/uL 207  221  186   NEUT# 1.7 - 7.7 K/uL 3.4     Lymph# 0.7 - 4.0 K/uL 2.0        There is no height or weight on file to calculate BMI.  Orders:  No orders of the defined types were placed in this encounter.  No orders of the defined types were placed in this encounter.    Procedures: No procedures performed  Clinical Data: No additional findings.  ROS:  All other systems negative, except as noted in the HPI. Review of Systems  Objective: Vital Signs: There were no vitals taken for this visit.  Specialty Comments:  No specialty comments available.  PMFS History: Patient Active  Problem List   Diagnosis Date Noted   Foot osteomyelitis, right (HCC) 05/01/2020   History reviewed. No pertinent past medical history.  History reviewed. No pertinent family history.  Past Surgical History:  Procedure Laterality Date   AMPUTATION Right 05/03/2020   Procedure: AMPUTATION BELOW KNEE;  Surgeon: Timothy Ford, MD;  Location: [facility] OR;  Service: Orthopedics;  Laterality: Right;   BACK SURGERY     Social History   Occupational History   Not on file  Tobacco Use   Smoking status: Every Day    Current packs/day: 0.50    Average packs/day: 0.5 packs/day for 20.0 years (10.0 ttl pk-yrs)    Types: Cigarettes   Smokeless tobacco: Never  Substance and Sexual Activity   Alcohol use: Yes   Drug use: Never   Sexual activity: Not on file

## 2023-12-13 ENCOUNTER — Emergency Department (HOSPITAL_COMMUNITY): Payer: Medicaid Other

## 2023-12-13 ENCOUNTER — Emergency Department (HOSPITAL_COMMUNITY)
Admission: EM | Admit: 2023-12-13 | Discharge: 2023-12-13 | Disposition: A | Discharge status: Home / Self Care | Payer: Medicaid Other | Attending: Emergency Medicine | Admitting: Emergency Medicine

## 2023-12-13 ENCOUNTER — Other Ambulatory Visit: Payer: Self-pay

## 2023-12-13 ENCOUNTER — Encounter (HOSPITAL_COMMUNITY): Payer: Self-pay | Admitting: *Deleted

## 2023-12-13 DIAGNOSIS — J069 Acute upper respiratory infection, unspecified: Secondary | ICD-10-CM | POA: Diagnosis not present

## 2023-12-13 DIAGNOSIS — R059 Cough, unspecified: Secondary | ICD-10-CM | POA: Diagnosis present

## 2023-12-13 DIAGNOSIS — Z20822 Contact with and (suspected) exposure to covid-19: Secondary | ICD-10-CM | POA: Insufficient documentation

## 2023-12-13 DIAGNOSIS — R079 Chest pain, unspecified: Secondary | ICD-10-CM

## 2023-12-13 DIAGNOSIS — R600 Localized edema: Secondary | ICD-10-CM | POA: Insufficient documentation

## 2023-12-13 LAB — CBC
HCT: 40.7 % (ref 39.0–52.0)
Hemoglobin: 14 g/dL (ref 13.0–17.0)
MCH: 33.1 pg (ref 26.0–34.0)
MCHC: 34.4 g/dL (ref 30.0–36.0)
MCV: 96.2 fL (ref 80.0–100.0)
Platelets: 124 10*3/uL — ABNORMAL LOW (ref 150–400)
RBC: 4.23 MIL/uL (ref 4.22–5.81)
RDW: 14.3 % (ref 11.5–15.5)
WBC: 5.8 10*3/uL (ref 4.0–10.5)
nRBC: 0 % (ref 0.0–0.2)

## 2023-12-13 LAB — BRAIN NATRIURETIC PEPTIDE: B Natriuretic Peptide: 37 pg/mL (ref 0.0–100.0)

## 2023-12-13 LAB — RESP PANEL BY RT-PCR (RSV, FLU A&B, COVID)  RVPGX2
Influenza A by PCR: NEGATIVE
Influenza B by PCR: NEGATIVE
Resp Syncytial Virus by PCR: NEGATIVE
SARS Coronavirus 2 by RT PCR: NEGATIVE

## 2023-12-13 LAB — BASIC METABOLIC PANEL
Anion gap: 7 (ref 5–15)
BUN: 9 mg/dL (ref 6–20)
CO2: 22 mmol/L (ref 22–32)
Calcium: 8.2 mg/dL — ABNORMAL LOW (ref 8.9–10.3)
Chloride: 104 mmol/L (ref 98–111)
Creatinine, Ser: 0.9 mg/dL (ref 0.61–1.24)
GFR, Estimated: >60 mL/min (ref >60)
Glucose, Bld: 137 mg/dL — ABNORMAL HIGH (ref 70–99)
Potassium: 3.7 mmol/L (ref 3.5–5.1)
Sodium: 133 mmol/L — ABNORMAL LOW (ref 135–145)

## 2023-12-13 LAB — MAGNESIUM: Magnesium: 1.9 mg/dL (ref 1.7–2.4)

## 2023-12-13 LAB — HEPATIC FUNCTION PANEL
ALT: 58 U/L — ABNORMAL HIGH (ref 0–44)
AST: 82 U/L — ABNORMAL HIGH (ref 15–41)
Albumin: 3.5 g/dL (ref 3.5–5.0)
Alkaline Phosphatase: 54 U/L (ref 38–126)
Bilirubin, Direct: 0.3 mg/dL — ABNORMAL HIGH (ref 0.0–0.2)
Indirect Bilirubin: 1 mg/dL — ABNORMAL HIGH (ref 0.3–0.9)
Total Bilirubin: 1.3 mg/dL — ABNORMAL HIGH (ref 0.0–1.2)
Total Protein: 7 g/dL (ref 6.5–8.1)

## 2023-12-13 LAB — TROPONIN I (HIGH SENSITIVITY)
Troponin I (High Sensitivity): 5 ng/L (ref <18)
Troponin I (High Sensitivity): 6 ng/L (ref <18)

## 2023-12-13 LAB — D-DIMER, QUANTITATIVE: D-Dimer, Quant: <0.27 ug{FEU}/mL (ref 0.00–0.50)

## 2023-12-13 MED ORDER — ONDANSETRON 4 MG PO TBDP
4.0000 mg | ORAL_TABLET | Freq: Once | ORAL | Status: AC
  Administered 2023-12-13: 4 mg via ORAL
  Filled 2023-12-13: qty 1

## 2023-12-13 MED ORDER — OXYCODONE-ACETAMINOPHEN 5-325 MG PO TABS
1.0000 | ORAL_TABLET | Freq: Once | ORAL | Status: AC
Start: 2023-12-13 — End: 2023-12-13
  Administered 2023-12-13: 1 via ORAL
  Filled 2023-12-13: qty 1

## 2023-12-13 MED ORDER — FUROSEMIDE 20 MG PO TABS: 20.0000 mg | ORAL_TABLET | Freq: Every day | ORAL | 0 refills | Status: AC

## 2023-12-13 MED ORDER — ASPIRIN 81 MG PO CHEW
324.0000 mg | CHEWABLE_TABLET | Freq: Once | ORAL | Status: AC
  Administered 2023-12-13: 324 mg via ORAL
  Filled 2023-12-13: qty 4

## 2023-12-13 MED ORDER — METOPROLOL TARTRATE 25 MG PO TABS
25.0000 mg | ORAL_TABLET | Freq: Once | ORAL | Status: AC
  Administered 2023-12-13: 25 mg via ORAL
  Filled 2023-12-13: qty 1

## 2023-12-13 MED ORDER — NITROGLYCERIN 2 % TD OINT
1.0000 [in_us] | TOPICAL_OINTMENT | Freq: Once | TRANSDERMAL | Status: AC
  Administered 2023-12-13: 1 [in_us] via TOPICAL
  Filled 2023-12-13: qty 1

## 2023-12-13 NOTE — ED Notes (Signed)
 12/13/23 1030: Lab called result HIV testing negative. EDP notified.

## 2023-12-13 NOTE — ED Triage Notes (Signed)
 Pt c/o chest pain that started a few days ago with sob

## 2023-12-13 NOTE — Discharge Instructions (Signed)
 You were seen for your upper respiratory tract infection, swelling, and chest pain in the emergency department.   At home, please take the lasix  we have prescribed you for several days to get rid of the fluid.    Check your MyChart online for the results of any tests that had not resulted by the time you left the emergency department.   Follow-up with your primary doctor in 2-3 days regarding your visit.  Follow-up with cardiology about your chest pain and swelling.   Return immediately to the emergency department if you experience any of the following: worsening pain, difficulty breathing, or any other concerning symptoms.    Thank you for visiting our Emergency Department. It was a pleasure taking care of you today.

## 2023-12-13 NOTE — ED Provider Notes (Signed)
 Patient presenting with 3 days of dry cough, shortness of breath and chest pain.  Symptoms worsen with any exertion.  He has peripheral edema on exam which he says is new.  He does not currently see a cardiologist.  He states that he does not take any blood pressure medications.  Blood pressure markedly elevated on arrival in the range of 190/100.  Current chest pain is 7/10 in severity.  EKG does not show ST segment elevations.  Percocet, Zofran , NTG paste, and ASA were ordered.  Care of patient to be assumed by oncoming ED provider.   Melvenia Motto, MD 12/13/23 [phone]

## 2023-12-13 NOTE — ED Provider Notes (Signed)
 [place] EMERGENCY DEPARTMENT AT [facility] Provider Note   CSN: [id number] Arrival date & time: 12/13/23  9375     History  Chief Complaint  Patient presents with   Chest Pain   Shortness of Breath    [name] is a 53 y.o. male.  53 year old male with a history of right lower extremity BKA from an injury who presents to the emergency department with cough, chest pain, and chills/sweats.  Patient reports that over the past week he has been feeling sick.  Says that he has had a nonproductive cough as well as congestion and a sore throat.  Reports that over the past 24 hours has developed chest pain.  Has difficulty characterizing it.  Substernal.  No radiation.  Says that he has had some vomiting and sweating.  Currently 3/10 in severity.  Says that when he walks around he feels like he starts to sweat.  No personal history of MI.  Thinks he may have also gained 20 pounds recently.  No history of cardiac disease or lung disease.        Home Medications Prior to Admission medications   Medication Sig Start Date End Date Taking? Authorizing Provider  furosemide  (LASIX ) 20 MG tablet Take 1 tablet (20 mg total) by mouth daily for 5 days. 12/13/23 12/18/23 Yes Yolande Lamar BROCKS, MD  ferrous gluconate  [facility]) 324 MG tablet Take 1 tablet (324 mg total) by mouth daily with breakfast. 05/07/20 06/06/20  Cindy Garnette POUR, MD  gabapentin  (NEURONTIN ) 100 MG capsule Take 1 capsule (100 mg total) by mouth daily. 05/06/20 06/05/20  Cindy Garnette POUR, MD  polyethylene glycol (MIRALAX  / GLYCOLAX ) 17 g packet Take 17 g by mouth daily as needed for mild constipation. 05/06/20   Cindy Garnette POUR, MD      Allergies    Ceftriaxone    Review of Systems   Review of Systems  Physical Exam Updated Vital Signs BP 123/69   Pulse 66   Temp 98 F (36.7 C) (Oral)   Resp (!) 21   Ht 6' (1.829 m)   Wt (!) 148.8 kg   SpO2 93%   BMI 44.48 kg/m  Physical Exam Vitals and nursing note  reviewed.  Constitutional:      General: He is not in acute distress.    Appearance: He is well-developed.  HENT:     Head: Normocephalic and atraumatic.     Nose: Congestion present.  Eyes:     Conjunctiva/sclera: Conjunctivae normal.  Cardiovascular:     Rate and Rhythm: Normal rate and regular rhythm.     Heart sounds: No murmur heard.    Comments: Radial pulses 2+ bilaterally Pulmonary:     Effort: Pulmonary effort is normal. No respiratory distress.     Breath sounds: Normal breath sounds.  Abdominal:     Palpations: Abdomen is soft.     Tenderness: There is no abdominal tenderness.  Musculoskeletal:        General: No swelling.     Cervical back: Neck supple.     Left lower leg: Edema present.     Comments: Right lower extremity BKA  Skin:    General: Skin is warm and dry.     Capillary Refill: Capillary refill takes less than 2 seconds.  Neurological:     Mental Status: He is alert.  Psychiatric:        Mood and Affect: Mood normal.     ED Results / Procedures /  Treatments   Labs (all labs ordered are listed, but only abnormal results are displayed) Labs Reviewed  BASIC METABOLIC PANEL - Abnormal; Notable for the following components:      Result Value   Sodium 133 (*)    Glucose, Bld 137 (*)    Calcium 8.2 (*)    All other components within normal limits  CBC - Abnormal; Notable for the following components:   Platelets 124 (*)    All other components within normal limits  HEPATIC FUNCTION PANEL - Abnormal; Notable for the following components:   AST 82 (*)    ALT 58 (*)    Total Bilirubin 1.3 (*)    Bilirubin, Direct 0.3 (*)    Indirect Bilirubin 1.0 (*)    All other components within normal limits  RESP PANEL BY RT-PCR (RSV, FLU A&B, COVID)  RVPGX2  MAGNESIUM   BRAIN NATRIURETIC PEPTIDE  D-DIMER, QUANTITATIVE  TROPONIN I (HIGH SENSITIVITY)  TROPONIN I (HIGH SENSITIVITY)    EKG EKG Interpretation Date/Time:  Wednesday December 13 2023 06:36:53  EST Ventricular Rate:  92 PR Interval:  146 QRS Duration:  86 QT Interval:  374 QTC Calculation: 462 R Axis:   2  Text Interpretation: Normal sinus rhythm Low voltage QRS Confirmed by Melvenia Motto [phone]) on 12/13/2023 6:46:15 AM  Radiology DG Chest Port 1 View Result Date: 12/13/2023 CLINICAL DATA:  Chest pain, cough EXAM: PORTABLE CHEST - 1 VIEW COMPARISON:  11/11/2023 FINDINGS: Lungs are clear.  No pneumothorax. Heart size and mediastinal contours are within normal limits. No effusion. Paired dorsal stimulator catheters extend up to the T9 level. IMPRESSION: No acute cardiopulmonary disease. Electronically Signed   By: JONETTA Faes M.D.   On: 12/13/2023 07:57    Procedures Procedures    Medications Ordered in ED Medications  aspirin  chewable tablet 324 mg (324 mg Oral Given 12/13/23 0716)  oxyCODONE -acetaminophen  (PERCOCET/ROXICET) 5-325 MG per tablet 1 tablet (1 tablet Oral Given 12/13/23 0718)  ondansetron  (ZOFRAN -ODT) disintegrating tablet 4 mg (4 mg Oral Given 12/13/23 0718)  nitroGLYCERIN  (NITROGLYN) 2 % ointment 1 inch (1 inch Topical Given 12/13/23 0719)  metoprolol  tartrate (LOPRESSOR ) tablet 25 mg (25 mg Oral Given 12/13/23 0718)    ED Course/ Medical Decision Making/ A&P                                 Medical Decision Making Amount and/or Complexity of Data Reviewed Labs: ordered.  Risk Prescription drug management.   [name] is a 53 y.o. male with comorbidities that complicate the patient evaluation including right lower extremity BKA from an injury who presents to the emergency department with cough, chest pain, and chills/sweats.   Initial Ddx:  URI, pneumonia, heart failure exacerbation, COPD exacerbation, PE  MDM/Course:  Patient presents emergency department with URI symptoms.  Also reports having a 20 pound weight gain.  Not septic on arrival and and is not in acute distress.  Satting well on room air.  Chest x-ray without evidence of pneumonia.  It did not  show signs of CHF either and his BNP was WNL.  COVID and flu negative.  Was complaining of some chest pain and shortness of breath so serial troponins were sent and were WNL.  D-dimer undetectably low so feel that PE is unlikely.  Upon re-evaluation patient was stable.  Suspect the patient has a URI that is causing his symptoms.  With his reported 20 pound  weight gain and lower extremity edema we will go ahead and give him some Lasix  in case he has a component of heart failure exacerbation though feel this is less likely.  Will him follow-up with his primary doctor in several days.  This patient presents to the ED for concern of complaints listed in HPI, this involves an extensive number of treatment options, and is a complaint that carries with it a high risk of complications and morbidity. Disposition including potential need for admission considered.   Dispo: DC Home. Return precautions discussed including, but not limited to, those listed in the AVS. Allowed pt time to ask questions which were answered fully prior to dc.  Records reviewed Outpatient Clinic Notes The following labs were independently interpreted: Chemistry and show no acute abnormality I independently reviewed the following imaging with scope of interpretation limited to determining acute life threatening conditions related to emergency care: Chest x-ray and agree with the radiologist interpretation with the following exceptions: none I personally reviewed and interpreted cardiac monitoring: normal sinus rhythm  I personally reviewed and interpreted the pt's EKG: see above for interpretation  I have reviewed the patients home medications and made adjustments as needed  Portions of this note were generated with Dragon dictation software. Dictation errors may occur despite best attempts at proofreading.     Final Clinical Impression(s) / ED Diagnoses Final diagnoses:  Chest pain, unspecified type  Upper respiratory tract  infection, unspecified type  Lower extremity edema    Rx / DC Orders ED Discharge Orders          Ordered    furosemide  (LASIX ) 20 MG tablet  Daily        12/13/23 0938    Ambulatory referral to Cardiology        12/13/23 0938              Yolande Lamar BROCKS, MD 12/14/23 1401

## 2023-12-22 ENCOUNTER — Telehealth: Payer: Self-pay

## 2023-12-22 ENCOUNTER — Other Ambulatory Visit (HOSPITAL_COMMUNITY): Payer: Self-pay

## 2023-12-22 NOTE — Telephone Encounter (Signed)
Pharmacy Patient Advocate Encounter  Insurance verification completed.   The patient is insured through [employer]  Ran test claim for [employer]. Currently a quantity of 28 is a 28 day supply.  Patient insurance is [place] and will not work in [place].   This test claim was processed through [facility]- copay amounts may vary at other pharmacies due to pharmacy/plan contracts, or as the patient moves through the different stages of their insurance plan.

## 2023-12-25 ENCOUNTER — Encounter: Payer: Medicaid Other | Admitting: Internal Medicine

## 2023-12-25 ENCOUNTER — Telehealth: Payer: Self-pay

## 2023-12-25 NOTE — Telephone Encounter (Signed)
 Called patient to reschedule missed appointment. No answer, left vm to contact clinic.

## 2024-02-14 ENCOUNTER — Ambulatory Visit: Payer: Medicaid Other | Attending: Internal Medicine | Admitting: Internal Medicine

## 2024-02-14 NOTE — Progress Notes (Signed)
 Erroneous encounter - please disregard.

## 2024-03-08 ENCOUNTER — Other Ambulatory Visit (HOSPITAL_COMMUNITY): Payer: Self-pay

## 2024-03-12 ENCOUNTER — Encounter: Admitting: Infectious Diseases

## 2024-03-13 ENCOUNTER — Telehealth: Payer: Self-pay | Admitting: Orthopedic Surgery

## 2024-03-13 NOTE — Telephone Encounter (Signed)
 [facility] needs the 12/12/23 dictation amended to state  "[name] is a willing and motivated amputee. [name] is a well healed ambulatory patient who has the potential for ambulation with the ability to traverse low-level environmental barriers like curbs,stairs or uneven surfaces, typical of a limited community ambulatory. A right transtibial definitive prosthesis with a K2 flexible foot will allow [name] to return to previous ADL'/activities such as auto cleaning,auto repair,cooking hunting and social engagement. [name] currently uses 1-walk crutches as an assistive device for ambulation". Once the notes are amended please fax to [facility] to [phone], ph [phone]

## 2024-03-14 NOTE — Telephone Encounter (Signed)
 Faxed

## 2024-03-22 ENCOUNTER — Telehealth: Payer: Self-pay | Admitting: Orthopedic Surgery

## 2024-03-22 NOTE — Telephone Encounter (Signed)
 Received vm from patient stating [facility] still doesn't have what they need for his prosthesis. He is very anxious about this. Please call patient at [phone]

## 2024-03-25 NOTE — Telephone Encounter (Signed)
 Faxed again. [phone].

## 2024-03-25 NOTE — Telephone Encounter (Signed)
 We have faxed in the amended notes from February and have also faxed the Rx form that they sent over on 03/13/24. I will refax over to them again.

## 2024-03-25 NOTE — Telephone Encounter (Signed)
 I have sent in what they have requested us  to send in. I have not heard back in regards of needing anything else. Will hold message so I can do more research.

## 2024-03-26 ENCOUNTER — Telehealth: Payer: Self-pay | Admitting: Orthopedic Surgery

## 2024-03-26 NOTE — Telephone Encounter (Signed)
 Felescia from [facility] called and wanted to know did you received the fax that she sent regarding the patient. CB#[phone]

## 2024-03-26 NOTE — Telephone Encounter (Signed)
 Faxed over form they sent this morning for physician portion to be completed.

## 2024-03-26 NOTE — Telephone Encounter (Signed)
 Updated form and faxed back to them.

## 2024-05-02 ENCOUNTER — Ambulatory Visit: Attending: Internal Medicine | Admitting: Internal Medicine

## 2024-05-02 NOTE — Progress Notes (Signed)
 Erroneous encounter - please disregard.

## 2024-05-09 ENCOUNTER — Ambulatory Visit: Admitting: Orthopedic Surgery
